# Patient Record
Sex: Female | Born: 1970
Health system: Southern US, Community
[De-identification: ages and names within clinical notes are randomized; demographics above are authoritative.]

## PROBLEM LIST (undated history)

## (undated) DIAGNOSIS — T7840XA Allergy, unspecified, initial encounter: Secondary | ICD-10-CM

## (undated) DIAGNOSIS — Z1501 Genetic susceptibility to malignant neoplasm of breast: Secondary | ICD-10-CM

## (undated) DIAGNOSIS — F419 Anxiety disorder, unspecified: Secondary | ICD-10-CM

## (undated) DIAGNOSIS — C801 Malignant (primary) neoplasm, unspecified: Secondary | ICD-10-CM

## (undated) DIAGNOSIS — E785 Hyperlipidemia, unspecified: Secondary | ICD-10-CM

## (undated) DIAGNOSIS — C50919 Malignant neoplasm of unspecified site of unspecified female breast: Secondary | ICD-10-CM

## (undated) HISTORY — PX: BREAST SURGERY: SHX581

## (undated) HISTORY — DX: Hyperlipidemia, unspecified: E78.5

## (undated) HISTORY — PX: ABDOMINAL HYSTERECTOMY: SHX81

## (undated) HISTORY — DX: Malignant (primary) neoplasm, unspecified: C80.1

## (undated) HISTORY — PX: MASTECTOMY: SHX3

## (undated) HISTORY — DX: Anxiety disorder, unspecified: F41.9

## (undated) HISTORY — DX: Allergy, unspecified, initial encounter: T78.40XA

## (undated) HISTORY — DX: Genetic susceptibility to malignant neoplasm of breast: Z15.01

---

## 1998-04-18 ENCOUNTER — Inpatient Hospital Stay (HOSPITAL_COMMUNITY): Admission: AD | Admit: 1998-04-18 | Discharge: 1998-04-21 | Payer: Self-pay | Admitting: Obstetrics and Gynecology

## 1998-05-30 ENCOUNTER — Inpatient Hospital Stay (HOSPITAL_COMMUNITY): Admission: AD | Admit: 1998-05-30 | Discharge: 1998-06-01 | Payer: Self-pay | Admitting: Obstetrics and Gynecology

## 1999-07-18 ENCOUNTER — Other Ambulatory Visit: Admission: RE | Admit: 1999-07-18 | Discharge: 1999-07-18 | Payer: Self-pay | Admitting: Obstetrics and Gynecology

## 2000-09-03 ENCOUNTER — Other Ambulatory Visit: Admission: RE | Admit: 2000-09-03 | Discharge: 2000-09-03 | Payer: Self-pay | Admitting: Obstetrics and Gynecology

## 2001-08-10 ENCOUNTER — Inpatient Hospital Stay (HOSPITAL_COMMUNITY): Admission: AD | Admit: 2001-08-10 | Discharge: 2001-08-13 | Payer: Self-pay | Admitting: Obstetrics and Gynecology

## 2001-08-10 ENCOUNTER — Encounter (INDEPENDENT_AMBULATORY_CARE_PROVIDER_SITE_OTHER): Payer: Self-pay

## 2001-09-09 ENCOUNTER — Other Ambulatory Visit: Admission: RE | Admit: 2001-09-09 | Discharge: 2001-09-09 | Payer: Self-pay | Admitting: Obstetrics and Gynecology

## 2001-12-12 ENCOUNTER — Other Ambulatory Visit: Admission: RE | Admit: 2001-12-12 | Discharge: 2001-12-12 | Payer: Self-pay | Admitting: Obstetrics and Gynecology

## 2002-09-11 ENCOUNTER — Other Ambulatory Visit: Admission: RE | Admit: 2002-09-11 | Discharge: 2002-09-11 | Payer: Self-pay | Admitting: Obstetrics and Gynecology

## 2003-09-14 ENCOUNTER — Other Ambulatory Visit: Admission: RE | Admit: 2003-09-14 | Discharge: 2003-09-14 | Payer: Self-pay | Admitting: Obstetrics and Gynecology

## 2006-03-23 ENCOUNTER — Ambulatory Visit: Payer: Self-pay

## 2006-04-07 ENCOUNTER — Ambulatory Visit: Payer: Self-pay

## 2006-04-16 ENCOUNTER — Ambulatory Visit: Payer: Self-pay | Admitting: Cardiology

## 2006-10-06 ENCOUNTER — Ambulatory Visit: Payer: Self-pay | Admitting: Cardiology

## 2007-09-29 ENCOUNTER — Ambulatory Visit: Payer: Self-pay | Admitting: Cardiology

## 2007-10-26 ENCOUNTER — Ambulatory Visit: Payer: Self-pay | Admitting: Cardiology

## 2007-10-26 LAB — CONVERTED CEMR LAB
Creatinine, Ser: 0.6 mg/dL (ref 0.4–1.2)
GFR calc Af Amer: 144 mL/min
Glucose, Bld: 115 mg/dL — ABNORMAL HIGH (ref 70–99)
Potassium: 3.8 meq/L (ref 3.5–5.1)

## 2007-10-27 ENCOUNTER — Ambulatory Visit: Payer: Self-pay | Admitting: Cardiology

## 2007-10-31 ENCOUNTER — Ambulatory Visit (HOSPITAL_COMMUNITY): Admission: RE | Admit: 2007-10-31 | Discharge: 2007-10-31 | Payer: Self-pay | Admitting: Cardiology

## 2009-11-23 HISTORY — PX: OTHER SURGICAL HISTORY: SHX169

## 2010-04-23 ENCOUNTER — Ambulatory Visit: Payer: Self-pay | Admitting: Oncology

## 2011-04-07 NOTE — Assessment & Plan Note (Signed)
[facility]                            CARDIOLOGY OFFICE NOTE   NAME:Tammy Gilmore, Tammy Gilmore                     MRN:          [id number]  DATE:09/29/2007                            DOB:          Mar 11, 1971    Tammy Gilmore is a very pleasant 40 year old female that I have seen in the  past secondary to palpitations and atypical chest pain.  Note, she had a  stress echocardiogram performed on Apr 07, 2006.  There was a question  of stress-induced ischemia in the mid septal wall.  However, she  exercised for 12 minutes and 15 seconds and is felt to be a low risk  study.  She has been treated medically.  She also has a history of  palpitations that are felt secondary to PACs and brief PAT.  Since I last saw her, she denies any dyspnea.  She does have some chest  soreness at times.  This is not clearly exertional nor is it pleuritic  or positional.  Her palpitations are unchanged.  They are brief and  nonsustained and there is no presyncope or syncope.   MEDICATIONS:  Benadryl at night and a multivitamin as needed.   PHYSICAL EXAMINATION:  VITAL SIGNS:  Show a blood pressure of 109/61,  and her pulse is 73.  She weighs 103 pounds.  HEENT:  Normal.  NECK:  Supple.  CHEST:  Clear.  CARDIOVASCULAR:  Reveals a regular rate and rhythm.  ABDOMEN:  Shows no tenderness.  EXTREMITIES:  Show no edema.   Her electrocardiogram shows a sinus rhythm at a rate of 76.  There are  no significant ST changes.   DIAGNOSES:  1. Atypical chest pain.  We will plan to proceed with a Myoview for      risk stratification.  This is particularly in light of her question      of septal ischemia on her previous stress echocardiogram.  I think      if it shows no significant abnormalities, then we will not proceed      with further ischemia evaluation.  2. Palpitations.  This is felt secondary to premature atrial      contractions.  They are self limited.  We could add a beta-blocker      in the future if they worsen.   Note, we will plan to see her back on an as needed basis.     Madolyn Frieze Jens Som, MD, [facility]  Electronically Signed    BSC/MedQ  DD: 09/29/2007  DT: 09/29/2007  Job #: [id number]

## 2011-04-10 NOTE — Op Note (Signed)
[facility]  Patient:    Tammy Gilmore, Tammy Gilmore Visit Number: [id number] MRN: [id number]          Service Type: OBS Location: 910A 9130 01 Attending Physician:  Lenoard Aden Dictated by:   Lenoard Aden, M.D. Proc. Date: 08/10/01 Admit Date:  08/10/2001                             Operative Report  PREOPERATIVE DIAGNOSES:       1. A 39-week intrauterine pregnancy.                               2. Homero Fellers breech presentation.  POSTOPERATIVE DIAGNOSES:      1. A 39-week intrauterine pregnancy.                               2. Homero Fellers breech presentation.  PROCEDURE:                    Primary low transverse cesarean section.  SURGEON:                      Lenoard Aden, M.D.  ASSISTANT:                    Genia Del, M.D.  ANESTHESIA:                   Spinal by Jean Rosenthal.  ESTIMATED BLOOD LOSS:         1000 cc.  COMPLICATIONS:                None.  DRAINS:                       Foley.  COUNTS:                       Correct.  DISPOSITION:                  Patient to recovery in good condition.  FINDINGS:                     Full-term living frank breech, Apgars 9 and 9. Placenta posterior, manually, intact with three-vessel cord noted. Normal tubes and ovaries.  DESCRIPTION OF PROCEDURE:     After being apprised of the risks of anesthesia, infection, bleeding, injury to abdominal organs with need for repair, the patient is brought to the operating room where she is administered spinal anesthetic without complications, prepped and draped in the usual sterile fashion, and Foley catheter placed. A small symptomatic perineal skin tag is removed using scissors and fine tooth pickup and sent to pathology. Good hemostasis noted. At this time, Pfannenstiel skin incision made with a scalpel after testing for adequate anesthesia and placement of dilute Marcaine solution. At this time, the incision is carried down to the fascia which  is nicked in the midline and opened transversely using Mayo scissors. Rectus muscles dissected sharply in midline and peritoneum entered sharply. Bladder blade placed. Visceral peritoneum scored in a smile-like fashion. Uterus scored in smile-like fashion. Atraumatic delivery of an 8-pound 3-ounce frank breech female using the usual maneuvers with flexion of the fetal vertex handed to the pediatricians in attendance receiving Apgars  of 8 and 9. The placenta was delivered manually, intact. Upon delivery of the placenta, there is noted to be a small defect in the posterior wall of the uterus. This does not appear to be placental tissue and appears to be most consistent with a decidual-type reaction but the decision is made not to excise this at this time due to no obvious issues regarding retained placental tissue. At this time, the incision is closed using a O Monocryl in a continuous running fashion. Good hemostasis noted. Bladder flap inspected and found to be dry. Fat fascia closed using #1 Vicryl. Upon closing half of the fascia, there is noted to be an increased amount of serosanguineous drainage, therefore, fascial incision is reopened and re-exploration reveals normal uterine incision. Paracolic gutters irrigated. All blood clots removed and good hemostasis is noted at this time. The fascia then re-closed using a #1 Vicryl in continuous running fashion. The skin closed using staples. The patient tolerates the procedure well and transfers to recovery room in good condition. Dictated by:   Lenoard Aden, M.D. Attending Physician:  Lenoard Aden DD:  08/10/01 TD:  08/10/01 Job: [id number]

## 2011-04-10 NOTE — H&P (Signed)
[facility]  Patient:    Tammy Gilmore, Tammy Gilmore Visit Number: [id number] MRN: [id number]          Service Type: OBS Location: 910A 9130 01 Attending Physician:  Lenoard Aden Dictated by:   Lenoard Aden, M.D. Admit Date:  08/10/2001                           History and Physical  CHIEF COMPLAINT:              Homero Fellers breech at term.  HISTORY OF PRESENT ILLNESS:   A 40 year old white female G2, P25, EDD August 16, 2001 at 39 weeks with persistent frank breech presentation and refusal of ECV for primary cesarean section.  PAST MEDICAL HISTORY:         Operative vaginal delivery complicated by mild shoulder dystocia in 1999.  No other medical or surgical hospitalizations.  FAMILY HISTORY:               Kidney disease, breast cancer, prostate cancer, colon cancer.  SOCIAL HISTORY:               Noncontributory.  MEDICATIONS:                  Prenatal vitamins and iron.  ALLERGIES:                    No known drug allergies.  PRENATAL LABORATORIES:        Uncomplicated.  Blood type O+.  Rh antibody negative.  Rubella immune.  Hepatitis B surface antigen negative.  HIV nonreactive.  GBS negative.  PHYSICAL EXAMINATION  GENERAL:                      Well-developed, well-nourished white female in no apparent distress.  HEENT:                        Normal.  LUNGS:                        Clear.  HEART:                        Regular rate and rhythm.  ABDOMEN:                      Soft, gravid, nontender.  Estimated fetal weight 7.5-8 pounds.  PELVIC:                       Cervix is 2 cm thick, frank breech, and -2.  EXTREMITIES:                  No cords.  NEUROLOGIC:                   Nonfocal.  IMPRESSION:                   1. A 39 week intrauterine pregnancy with                                  persistent frank breech presentation.  Patient refused ECV.                               2. History  of shoulder dystocia.  PLAN:                         Proceed with primary low segment cesarean section.  Risks of anesthesia, infection, bleeding, injury to abdominal organs, need for repair is discussed.  Delayed versus immediate complications to include bowel and bladder injury noted.  Patient acknowledges.  Will proceed. Dictated by:   Lenoard Aden, M.D. Attending Physician:  Lenoard Aden DD:  08/10/01 TD:  08/10/01 Job: [id number]

## 2011-04-10 NOTE — Discharge Summary (Signed)
[facility]  Patient:    Tammy Gilmore, Tammy Gilmore Visit Number: [id number] MRN: [id number]          Service Type: OBS Location: 910A 9130 01 Attending Physician:  Lenoard Aden Dictated by:   Lenoard Aden, M.D. Admit Date:  08/10/2001 Discharge Date: 08/13/2001                             Discharge Summary  ADMITTING DIAGNOSES:          1. A 39-week intrauterine pregnancy.                               2. Homero Fellers breech.  DISCHARGE DIAGNOSES:          1. A 39-week intrauterine pregnancy.                               2. Homero Fellers breech.  HOSPITAL COURSE:              The patient underwent an uncomplicated primary cesarean section on August 10, 2001. Her postoperative course was uncomplicated. Hemoglobin 9.5, hematocrit 26.9. The patient remained afebrile, tolerated a regular diet well, ambulated without difficulty, and was discharged to home on day #3.  DISCHARGE MEDICATIONS:        Tylox, #30, Motrin 600 mg, #20, given. Prenatal vitamins and iron recommended.  DISCHARGE FOLLOWUP:           The patient is to follow up in the office in four to six weeks. Dictated by:   Lenoard Aden, M.D. Attending Physician:  Lenoard Aden DD:  08/13/01 TD:  08/13/01 Job: [id number]

## 2012-10-13 ENCOUNTER — Other Ambulatory Visit: Payer: Self-pay

## 2013-02-27 DIAGNOSIS — Z9013 Acquired absence of bilateral breasts and nipples: Secondary | ICD-10-CM | POA: Insufficient documentation

## 2013-02-27 DIAGNOSIS — Z1501 Genetic susceptibility to malignant neoplasm of breast: Secondary | ICD-10-CM | POA: Insufficient documentation

## 2014-02-09 DIAGNOSIS — Z1501 Genetic susceptibility to malignant neoplasm of breast: Secondary | ICD-10-CM | POA: Insufficient documentation

## 2014-02-14 DIAGNOSIS — E8940 Asymptomatic postprocedural ovarian failure: Secondary | ICD-10-CM | POA: Insufficient documentation

## 2017-03-23 DIAGNOSIS — Z9013 Acquired absence of bilateral breasts and nipples: Secondary | ICD-10-CM | POA: Diagnosis not present

## 2017-04-09 DIAGNOSIS — Z9013 Acquired absence of bilateral breasts and nipples: Secondary | ICD-10-CM | POA: Diagnosis not present

## 2017-05-20 DIAGNOSIS — Z1501 Genetic susceptibility to malignant neoplasm of breast: Secondary | ICD-10-CM | POA: Diagnosis not present

## 2017-05-20 DIAGNOSIS — Z90722 Acquired absence of ovaries, bilateral: Secondary | ICD-10-CM | POA: Diagnosis not present

## 2017-05-20 DIAGNOSIS — Z803 Family history of malignant neoplasm of breast: Secondary | ICD-10-CM | POA: Diagnosis not present

## 2017-05-20 DIAGNOSIS — Z09 Encounter for follow-up examination after completed treatment for conditions other than malignant neoplasm: Secondary | ICD-10-CM | POA: Diagnosis not present

## 2017-07-18 DIAGNOSIS — N3001 Acute cystitis with hematuria: Secondary | ICD-10-CM | POA: Diagnosis not present

## 2017-08-10 DIAGNOSIS — L821 Other seborrheic keratosis: Secondary | ICD-10-CM | POA: Diagnosis not present

## 2017-08-10 DIAGNOSIS — Z85828 Personal history of other malignant neoplasm of skin: Secondary | ICD-10-CM | POA: Diagnosis not present

## 2017-08-10 DIAGNOSIS — D225 Melanocytic nevi of trunk: Secondary | ICD-10-CM | POA: Diagnosis not present

## 2017-08-10 DIAGNOSIS — L918 Other hypertrophic disorders of the skin: Secondary | ICD-10-CM | POA: Diagnosis not present

## 2017-08-10 DIAGNOSIS — L570 Actinic keratosis: Secondary | ICD-10-CM | POA: Diagnosis not present

## 2017-09-14 DIAGNOSIS — Z23 Encounter for immunization: Secondary | ICD-10-CM | POA: Diagnosis not present

## 2017-11-25 DIAGNOSIS — H524 Presbyopia: Secondary | ICD-10-CM | POA: Diagnosis not present

## 2017-11-25 DIAGNOSIS — H52223 Regular astigmatism, bilateral: Secondary | ICD-10-CM | POA: Diagnosis not present

## 2018-03-29 DIAGNOSIS — Z9013 Acquired absence of bilateral breasts and nipples: Secondary | ICD-10-CM | POA: Diagnosis not present

## 2018-08-30 DIAGNOSIS — L570 Actinic keratosis: Secondary | ICD-10-CM | POA: Diagnosis not present

## 2018-08-30 DIAGNOSIS — Z85828 Personal history of other malignant neoplasm of skin: Secondary | ICD-10-CM | POA: Diagnosis not present

## 2018-08-30 DIAGNOSIS — L821 Other seborrheic keratosis: Secondary | ICD-10-CM | POA: Diagnosis not present

## 2018-12-14 DIAGNOSIS — Z124 Encounter for screening for malignant neoplasm of cervix: Secondary | ICD-10-CM | POA: Diagnosis not present

## 2018-12-14 DIAGNOSIS — Z90722 Acquired absence of ovaries, bilateral: Secondary | ICD-10-CM | POA: Diagnosis not present

## 2018-12-14 DIAGNOSIS — Z1151 Encounter for screening for human papillomavirus (HPV): Secondary | ICD-10-CM | POA: Diagnosis not present

## 2018-12-14 DIAGNOSIS — Z1501 Genetic susceptibility to malignant neoplasm of breast: Secondary | ICD-10-CM | POA: Diagnosis not present

## 2018-12-14 DIAGNOSIS — Z1509 Genetic susceptibility to other malignant neoplasm: Secondary | ICD-10-CM | POA: Diagnosis not present

## 2018-12-14 DIAGNOSIS — N952 Postmenopausal atrophic vaginitis: Secondary | ICD-10-CM | POA: Diagnosis not present

## 2018-12-17 DIAGNOSIS — Z90722 Acquired absence of ovaries, bilateral: Secondary | ICD-10-CM | POA: Insufficient documentation

## 2019-01-16 DIAGNOSIS — R300 Dysuria: Secondary | ICD-10-CM | POA: Diagnosis not present

## 2019-01-25 ENCOUNTER — Ambulatory Visit: Payer: BLUE CROSS/BLUE SHIELD | Admitting: Family Medicine

## 2019-01-25 ENCOUNTER — Encounter: Payer: Self-pay | Admitting: Family Medicine

## 2019-01-25 VITALS — BP 130/80 | HR 78 | Temp 98.3°F | Ht 60.0 in | Wt 116.2 lb

## 2019-01-25 DIAGNOSIS — Z23 Encounter for immunization: Secondary | ICD-10-CM | POA: Diagnosis not present

## 2019-01-25 DIAGNOSIS — Z90722 Acquired absence of ovaries, bilateral: Secondary | ICD-10-CM | POA: Diagnosis not present

## 2019-01-25 DIAGNOSIS — Z9013 Acquired absence of bilateral breasts and nipples: Secondary | ICD-10-CM | POA: Diagnosis not present

## 2019-01-25 DIAGNOSIS — Z7689 Persons encountering health services in other specified circumstances: Secondary | ICD-10-CM

## 2019-01-25 NOTE — Progress Notes (Signed)
Tammy Gilmore is a 48 y.o. female  Chief Complaint  Patient presents with  . Establish Care    est care/ wants TDAP    HPI: Tammy Gilmore is a 48 y.o. female here to establish care with our office. She was last seen in 2016 by PCP Precious Haws. She would like Tdap today.   She was seen at [facility] last Monday 2/24 and diagnosed with UTI and is being treated with cipro. Her dysuria has resolved but still is having some frequency. No fever, chills, n/v, back or abdominal pain. No gross hematuria.  Pt is BRCA2 positive and is s/p B/L mastectomy with reconstruction and implants (2012) and B/L salpingo-oophorectomy (2011)  Specialists: Gyn-Onc (Dr. Rhodia Albright), plastic & reconstructive surgery (Dr. Luetta Nutting  Last CPE, labs: due  Last PAP: 11/2018  Med refills needed today: none   History reviewed. No pertinent past medical history.  Past Surgical History:  Procedure Laterality Date  . ABDOMINAL HYSTERECTOMY     ovaries and fallopian tubes  . double masectomy  2011    Social History   Socioeconomic History  . Marital status: Married    Spouse name: Not on file  . Number of children: Not on file  . Years of education: Not on file  . Highest education level: Not on file  Occupational History  . Not on file  Social Needs  . Financial resource strain: Not on file  . Food insecurity:    Worry: Not on file    Inability: Not on file  . Transportation needs:    Medical: Not on file    Non-medical: Not on file  Tobacco Use  . Smoking status: Never Smoker  . Smokeless tobacco: Never Used  Substance and Sexual Activity  . Alcohol use: Never    Frequency: Never  . Drug use: Never  . Sexual activity: Not on file  Lifestyle  . Physical activity:    Days per week: Not on file    Minutes per session: Not on file  . Stress: Not on file  Relationships  . Social connections:    Talks on phone: Not on file    Gets together: Not on file    Attends religious service: Not on file   Active member of club or organization: Not on file    Attends meetings of clubs or organizations: Not on file    Relationship status: Not on file  . Intimate partner violence:    Fear of current or ex partner: Not on file    Emotionally abused: Not on file    Physically abused: Not on file    Forced sexual activity: Not on file  Other Topics Concern  . Not on file  Social History Narrative  . Not on file    Family History  Problem Relation Age of Onset  . Cancer Father        lung  . Cancer Brother        gall bladder  . Breast cancer Paternal Grandmother      Immunization History  Administered Date(s) Administered  . Influenza, Quadrivalent, Recombinant, Inj, Pf 10/03/2018  . Influenza,inj,Quad PF,6+ Mos 11/14/2015    Outpatient Encounter Medications as of 01/25/2019  Medication Sig  . ciprofloxacin (CIPRO) 500 MG tablet TK 1 T PO BID   No facility-administered encounter medications on file as of 01/25/2019.      ROS: Gen: no fever, chills  Skin: no rash, itching ENT: nasal congestion, Rt ear  fullness, runny nose, PND; no sore throat Eyes: no blurry vision, double vision Resp: no cough, wheeze,SOB CV: no CP, palpitations, LE edema,  GI: no heartburn, n/v/d/c, abd pain GU: as above in HPI MSK: no joint pain, myalgias, back pain Neuro: no dizziness, headache, weakness, vertigo Psych: no depression, anxiety, insomnia   No Known Allergies  BP 130/80   Pulse 78   Temp 98.3 F (36.8 C) (Oral)   Ht 5' (1.524 m)   Wt 116 lb 3.2 oz (52.7 kg)   SpO2 98%   BMI 22.69 kg/m   BP Readings from Last 3 Encounters:  01/25/19 130/80     Physical Exam  Constitutional: She is oriented to person, place, and time. She appears well-developed and well-nourished. No distress.  HENT:  Head: Normocephalic and atraumatic.  Right Ear: Tympanic membrane and ear canal normal.  Left Ear: Tympanic membrane and ear canal normal.  Nose: Mucosal edema present. No rhinorrhea. Right  sinus exhibits no maxillary sinus tenderness and no frontal sinus tenderness. Left sinus exhibits no maxillary sinus tenderness and no frontal sinus tenderness.  Mouth/Throat: Mucous membranes are normal. No oropharyngeal exudate, posterior oropharyngeal edema or posterior oropharyngeal erythema.  Neck: Neck supple.  Pulmonary/Chest: Effort normal and breath sounds normal. No respiratory distress.  Lymphadenopathy:    She has no cervical adenopathy.  Neurological: She is alert and oriented to person, place, and time.     A/P:  1. Encounter to establish care with new doctor - pt due for CPE, fasting labs and will schedule appt - PAP UTD  2. Need for Tdap vaccination - Tdap vaccine greater than or equal to 7yo IM  3. Status post bilateral salpingo-oophorectomy - cont annual f/u with Gyn-Onc  4. Status post bilateral mastectomy - cont annual f/u with plastic and recon surgery

## 2019-02-01 ENCOUNTER — Encounter: Payer: BLUE CROSS/BLUE SHIELD | Admitting: Family Medicine

## 2019-02-08 ENCOUNTER — Other Ambulatory Visit: Payer: Self-pay

## 2019-02-08 ENCOUNTER — Encounter: Payer: Self-pay | Admitting: Family Medicine

## 2019-02-08 ENCOUNTER — Ambulatory Visit: Payer: BLUE CROSS/BLUE SHIELD | Admitting: Family Medicine

## 2019-02-08 VITALS — BP 108/72 | HR 76 | Temp 98.5°F | Ht 60.0 in | Wt 113.4 lb

## 2019-02-08 DIAGNOSIS — Z0000 Encounter for general adult medical examination without abnormal findings: Secondary | ICD-10-CM | POA: Diagnosis not present

## 2019-02-08 LAB — TSH: TSH: 1.79 u[IU]/mL (ref 0.35–4.50)

## 2019-02-08 LAB — LIPID PANEL
Cholesterol: 267 mg/dL — ABNORMAL HIGH (ref 0–200)
HDL: 73.6 mg/dL (ref >39.00)
LDL Cholesterol: 175 mg/dL — ABNORMAL HIGH (ref 0–99)
NONHDL: 193.36
Total CHOL/HDL Ratio: 4
Triglycerides: 91 mg/dL (ref 0.0–149.0)
VLDL: 18.2 mg/dL (ref 0.0–40.0)

## 2019-02-08 LAB — AST: AST: 17 U/L (ref 0–37)

## 2019-02-08 LAB — BASIC METABOLIC PANEL
BUN: 14 mg/dL (ref 6–23)
CO2: 29 mEq/L (ref 19–32)
CREATININE: 0.55 mg/dL (ref 0.40–1.20)
Calcium: 9.5 mg/dL (ref 8.4–10.5)
Chloride: 104 mEq/L (ref 96–112)
GFR: 117.09 mL/min (ref >60.00)
Glucose, Bld: 100 mg/dL — ABNORMAL HIGH (ref 70–99)
POTASSIUM: 4.2 meq/L (ref 3.5–5.1)
Sodium: 140 mEq/L (ref 135–145)

## 2019-02-08 LAB — VITAMIN D 25 HYDROXY (VIT D DEFICIENCY, FRACTURES): VITD: 25.63 ng/mL — AB (ref 30.00–100.00)

## 2019-02-08 LAB — ALT: ALT: 22 U/L (ref 0–35)

## 2019-02-08 NOTE — Patient Instructions (Signed)

## 2019-02-08 NOTE — Progress Notes (Signed)
Tammy Gilmore is a 48 y.o. female  Chief Complaint  Patient presents with  . Annual Exam    CPE/ fasting     HPI: Tammy Gilmore is a 48 y.o. female here for CPE and fasting labs. She has no active issues or concerns. She notes seasonal allergy symptoms and just this week started taking claritin daily and using nasonex.   Last PAP: 11/2018 Last mammo: Pt is BRCA2 positive and is s/p B/L mastectomy with reconstruction and implants (2012)   Diet/Exercise: goes to gym regularly; healthy diet  Med refills needed today? none    No past medical history on file.  Past Surgical History:  Procedure Laterality Date  . ABDOMINAL HYSTERECTOMY     ovaries and fallopian tubes  . double masectomy  2011    Social History   Socioeconomic History  . Marital status: Married    Spouse name: Not on file  . Number of children: Not on file  . Years of education: Not on file  . Highest education level: Not on file  Occupational History  . Not on file  Social Needs  . Financial resource strain: Not on file  . Food insecurity:    Worry: Not on file    Inability: Not on file  . Transportation needs:    Medical: Not on file    Non-medical: Not on file  Tobacco Use  . Smoking status: Never Smoker  . Smokeless tobacco: Never Used  Substance and Sexual Activity  . Alcohol use: Never    Frequency: Never  . Drug use: Never  . Sexual activity: Not on file  Lifestyle  . Physical activity:    Days per week: Not on file    Minutes per session: Not on file  . Stress: Not on file  Relationships  . Social connections:    Talks on phone: Not on file    Gets together: Not on file    Attends religious service: Not on file    Active member of club or organization: Not on file    Attends meetings of clubs or organizations: Not on file    Relationship status: Not on file  . Intimate partner violence:    Fear of current or ex partner: Not on file    Emotionally abused: Not on file   Physically abused: Not on file    Forced sexual activity: Not on file  Other Topics Concern  . Not on file  Social History Narrative  . Not on file    Family History  Problem Relation Age of Onset  . Cancer Father        lung  . Cancer Brother        gall bladder  . Breast cancer Paternal Grandmother      Immunization History  Administered Date(s) Administered  . Influenza, Quadrivalent, Recombinant, Inj, Pf 10/03/2018  . Influenza,inj,Quad PF,6+ Mos 11/14/2015  . Tdap 01/25/2019    Outpatient Encounter Medications as of 02/08/2019  Medication Sig  . [DISCONTINUED] ciprofloxacin (CIPRO) 500 MG tablet TK 1 T PO BID   No facility-administered encounter medications on file as of 02/08/2019.      ROS: Gen: no fever, chills  Skin: no rash, itching ENT: no ear pain, ear drainage, + nasal congestion, rhinorrhea, PND; no sinus pressure, sore throat Resp: no cough, wheeze,SOB Breast: no breast tenderness, no nipple discharge, no breast masses CV: no CP, palpitations, LE edema,  GI: no heartburn, n/v/d/c, abd pain GU:  no dysuria, urgency, frequency, hematuria MSK: no joint pain, myalgias, back pain Neuro: no dizziness, headache, weakness, vertigo Psych: no depression, anxiety, insomnia   No Known Allergies  BP 108/72   Pulse 76   Temp 98.5 F (36.9 C) (Oral)   Ht 5' (1.524 m)   Wt 113 lb 6.4 oz (51.4 kg)   SpO2 97%   BMI 22.15 kg/m   Physical Exam  Constitutional: She is oriented to person, place, and time. She appears well-developed and well-nourished. No distress.  HENT:  Head: Normocephalic and atraumatic.  Right Ear: Tympanic membrane and ear canal normal.  Left Ear: Tympanic membrane and ear canal normal.  Nose: Nose normal.  Mouth/Throat: Oropharynx is clear and moist.  Neck: Neck supple. No thyromegaly present.  Cardiovascular: Normal rate, regular rhythm, normal heart sounds and intact distal pulses.  Pulmonary/Chest: Effort normal and breath sounds  normal. No respiratory distress.  Abdominal: Soft. Bowel sounds are normal. She exhibits no distension and no mass. There is no abdominal tenderness.  Musculoskeletal: Normal range of motion.        General: No edema.  Lymphadenopathy:    She has no cervical adenopathy.  Neurological: She is alert and oriented to person, place, and time. Coordination normal.  Skin: Skin is warm and dry.  Psychiatric: She has a normal mood and affect. Her behavior is normal.     A/P:  1. Annual physical exam - PAP UTD, not a candidate for mammo - discussed importance of regular CV exercise, healthy diet, adequate sleep and encouraged pt to continue this - dental and eye exams UTD - immunizations UTD - ALT - Basic metabolic panel - VITAMIN D 25 Hydroxy (Vit-D Deficiency, Fractures) - AST - Lipid panel - TSH - pt requests TSH be checked - next CPE in 1 year

## 2019-02-09 ENCOUNTER — Encounter: Payer: Self-pay | Admitting: Family Medicine

## 2019-02-23 ENCOUNTER — Ambulatory Visit: Payer: Self-pay | Admitting: *Deleted

## 2019-02-23 NOTE — Telephone Encounter (Signed)
Dr. Loletha Grayer please advise would you like me to set up webex?

## 2019-02-23 NOTE — Telephone Encounter (Signed)
Pt reports "Very slight" chest tightness, onset during night. Denies pain, SOB. States also had chills last night, states afebrile. States had sinus infection in Jan. 2020 "And this feels like one is coming on." Reports mild sinus tenderness, no cough. Also reports 2 loose BMs this am. Reports increased fatigue. Has to rest between usual ADLs. Pts email verified, phone # verified. Directed pt to ED if symtpoms worsen. Please advise regarding virtual appt; reviewed process with patient, verbalizes understanding.   Reason for Disposition . [1] Chest pain lasting <= 5 minutes AND [2] NO chest pain or cardiac symptoms now(Exceptions: pains lasting a few seconds)    Intermittent "Tightness"  Answer Assessment - Initial Assessment Questions 1. LOCATION: "Where does it hurt?"       tightness 2. RADIATION: "Does the pain go anywhere else?" (e.g., into neck, jaw, arms, back)    no 3. ONSET: "When did the chest pain begin?" (Minutes, hours or days)      LAst night 4. PATTERN "Does the pain come and go, or has it been constant since it started?"  "Does it get worse with exertion?"      Comes and goes 5. DURATION: "How long does it last" (e.g., seconds, minutes, hours)     mild 6. SEVERITY: "How bad is the pain?"  (e.g., Scale 1-10; mild, moderate, or severe)    - MILD (1-3): doesn't interfere with normal activities     - MODERATE (4-7): interferes with normal activities or awakens from sleep    - SEVERE (8-10): excruciating pain, unable to do any normal activities       Mild 7. CARDIAC RISK FACTORS: "Do you have any history of heart problems or risk factors for heart disease?" (e.g., prior heart attack, angina; high blood pressure, diabetes, being overweight, high cholesterol, smoking, or strong family history of heart disease)     *No Answer* 8. PULMONARY RISK FACTORS: "Do you have any history of lung disease?"  (e.g., blood clots in lung, asthma, emphysema, birth control pills)     *No Answer* 9.  CAUSE: "What do you think is causing the chest pain?"     Maybe sinus infection, had in Jan. Feels  Like that 10. OTHER SYMPTOMS: "Do you have any other symptoms?" (e.g., dizziness, nausea, vomiting, sweating, fever, difficulty breathing, cough)       Sinus tenderness, chills, loose stools x 2 , increased fatigue 11. PREGNANCY: "Is there any chance you are pregnant?" "When was your last menstrual period?"       *No Answer*  Protocols used: CHEST PAIN-A-AH

## 2019-02-23 NOTE — Telephone Encounter (Signed)
webex made for 4/3 at [facility]

## 2019-02-23 NOTE — Telephone Encounter (Signed)
Yes please set up webex appt for pt tomorrow 4/3

## 2019-02-24 ENCOUNTER — Other Ambulatory Visit: Payer: Self-pay

## 2019-02-24 ENCOUNTER — Ambulatory Visit (INDEPENDENT_AMBULATORY_CARE_PROVIDER_SITE_OTHER): Payer: BLUE CROSS/BLUE SHIELD | Admitting: Family Medicine

## 2019-02-24 ENCOUNTER — Encounter: Payer: Self-pay | Admitting: Family Medicine

## 2019-02-24 VITALS — Temp 98.0°F

## 2019-02-24 DIAGNOSIS — J302 Other seasonal allergic rhinitis: Secondary | ICD-10-CM

## 2019-02-24 NOTE — Progress Notes (Signed)
Virtual Visit via Video Note  I connected with Tammy Gilmore on 02/24/19 at  9:00 AM EDT by a video enabled telemedicine application and verified that I am speaking with the correct person using two identifiers. Location patient: home Location provider: work or home office Persons participating in the virtual visit: patient, provider  I discussed the limitations of evaluation and management by telemedicine and the availability of in person appointments. The patient expressed understanding and agreed to proceed.  Chief Complaint  Patient presents with  . Headache    chills,postnasal drip,no fever/ OTC musinex and tylenol/ 2days     HPI: Tammy Gilmore is a 48 y.o. female who complains of 2 day h/o "mild sinus headache", PND, throat clearning. Pt states she "felt chilled" 2 nights ago.  No fever. No myalgias. No runny nose, nasal congestion, sore throat. No cough or SOB. No fatigue. 2 episodes of loose stools 2 days ago, normal daily BM since then. No n/v. No abd pain. Pt has been taking tylenol PRN and mucinex. She has flonase and zyrtec but has not been taking.  No known sick contacts. No travel outside of [place] area. She does endorse a h/o seasonal allergies.   No past medical history on file.  Past Surgical History:  Procedure Laterality Date  . ABDOMINAL HYSTERECTOMY     ovaries and fallopian tubes  . double masectomy  2011    Family History  Problem Relation Age of Onset  . Cancer Father        lung  . Cancer Brother        gall bladder  . Breast cancer Paternal Grandmother     Social History   Tobacco Use  . Smoking status: Never Smoker  . Smokeless tobacco: Never Used  Substance Use Topics  . Alcohol use: Never    Frequency: Never  . Drug use: Never    No current outpatient medications on file.  No Known Allergies    ROS: See pertinent positives and negatives per HPI.   EXAM:  VITALS per patient if applicable:  GENERAL: alert, oriented,  appears well and in no acute distress  HEENT: atraumatic, conjunctiva clear, no obvious abnormalities on inspection of external nose and ears  NECK: normal movements of the head and neck  LUNGS: on inspection no signs of respiratory distress, breathing rate appears normal, no obvious gross SOB, gasping or wheezing, no conversational dyspnea  CV: no obvious cyanosis  MS: moves all visible extremities without noticeable abnormality  PSYCH/NEURO: pleasant and cooperative, no obvious depression or anxiety, speech and thought processing grossly intact   ASSESSMENT AND PLAN:  1. Seasonal allergies - restart zyrtec and flonase daily - add nasal saline spray 3x/day  - cont supportive care to include increased fluids, rest, tylenol PRN - ok to continue with mucinex BID x 1 week - f/u if symptoms worsen at any point or do not improve in 2 wks   I discussed the assessment and treatment plan with the patient. The patient was provided an opportunity to ask questions and all were answered. The patient agreed with the plan and demonstrated an understanding of the instructions.   The patient was advised to call back or seek an in-person evaluation if the symptoms worsen or if the condition fails to improve as anticipated.   Letta Median, DO

## 2019-02-28 ENCOUNTER — Encounter: Payer: Self-pay | Admitting: Family Medicine

## 2019-03-02 ENCOUNTER — Encounter: Payer: Self-pay | Admitting: Family Medicine

## 2019-03-02 ENCOUNTER — Ambulatory Visit (INDEPENDENT_AMBULATORY_CARE_PROVIDER_SITE_OTHER): Payer: BLUE CROSS/BLUE SHIELD | Admitting: Family Medicine

## 2019-03-02 ENCOUNTER — Ambulatory Visit: Payer: Self-pay

## 2019-03-02 VITALS — Temp 98.5°F

## 2019-03-02 DIAGNOSIS — Z634 Disappearance and death of family member: Secondary | ICD-10-CM | POA: Insufficient documentation

## 2019-03-02 DIAGNOSIS — F4329 Adjustment disorder with other symptoms: Secondary | ICD-10-CM | POA: Insufficient documentation

## 2019-03-02 DIAGNOSIS — J301 Allergic rhinitis due to pollen: Secondary | ICD-10-CM | POA: Diagnosis not present

## 2019-03-02 MED ORDER — PREDNISONE 10 MG PO TABS: 10.0000 mg | ORAL_TABLET | Freq: Two times a day (BID) | ORAL | 0 refills | Status: AC

## 2019-03-02 MED ORDER — HYDROXYZINE HCL 10 MG PO TABS: 10.0000 mg | ORAL_TABLET | Freq: Three times a day (TID) | ORAL | 0 refills | Status: DC | PRN

## 2019-03-02 NOTE — Progress Notes (Signed)
Virtual Visit via Video Note  I connected with Tammy Gilmore on 03/02/19 at  2:30 PM EDT by a video enabled telemedicine application and verified that I am speaking with the correct person using two identifiers.   I discussed the limitations of evaluation and management by telemedicine and the availability of in person appointments. The patient expressed understanding and agreed to proceed.  History of Present Illness:    Observations/Objective:   Assessment and Plan:   Follow Up Instructions:    I discussed the assessment and treatment plan with the patient. The patient was provided an opportunity to ask questions and all were answered. The patient agreed with the plan and demonstrated an understanding of the instructions.   The patient was advised to call back or seek an in-person evaluation if the symptoms worsen or if the condition fails to improve as anticipated.  I provided 20 minutes of non-face-to-face time during this encounter.   Libby Maw, MD   Established Patient Office Visit  Subjective:  Patient ID: Tammy Gilmore, female    DOB: 1971/06/02  Age: 48 y.o. MRN: [id number]  CC:  Chief Complaint  Patient presents with  . Diarrhea    loose stool, a little short of breath, no fever, fatigued/not feeling better from last visit with Dr. Loletha Grayer 4/3 dx with allergies     HPI KISHANA BATTEY presents for follow-up status post 8-day history of itchy nose postnasal drip sneezing and watery eyes.  She feels stressed and is worried about having a Covid infection but denies fevers chills, malaise, fatigue, wheezing, shortness of breath, dyspnea, facial pressure, teeth pain, rhinorrhea or phlegm.  She has no asthma history.  She has never smoked.  She is feeling better.  She denies nausea and vomiting but has experienced some decreased appetite.  She is having some loose stool but not denies watery diarrhea.  No past medical history on file.  Past Surgical  History:  Procedure Laterality Date  . ABDOMINAL HYSTERECTOMY     ovaries and fallopian tubes  . double masectomy  2011    Family History  Problem Relation Age of Onset  . Cancer Father        lung  . Cancer Brother        gall bladder  . Breast cancer Paternal Grandmother     Social History   Socioeconomic History  . Marital status: Married    Spouse name: Not on file  . Number of children: Not on file  . Years of education: Not on file  . Highest education level: Not on file  Occupational History  . Not on file  Social Needs  . Financial resource strain: Not on file  . Food insecurity:    Worry: Not on file    Inability: Not on file  . Transportation needs:    Medical: Not on file    Non-medical: Not on file  Tobacco Use  . Smoking status: Never Smoker  . Smokeless tobacco: Never Used  Substance and Sexual Activity  . Alcohol use: Never    Frequency: Never  . Drug use: Never  . Sexual activity: Not on file  Lifestyle  . Physical activity:    Days per week: Not on file    Minutes per session: Not on file  . Stress: Not on file  Relationships  . Social connections:    Talks on phone: Not on file    Gets together: Not on file  Attends religious service: Not on file    Active member of club or organization: Not on file    Attends meetings of clubs or organizations: Not on file    Relationship status: Not on file  . Intimate partner violence:    Fear of current or ex partner: Not on file    Emotionally abused: Not on file    Physically abused: Not on file    Forced sexual activity: Not on file  Other Topics Concern  . Not on file  Social History Narrative  . Not on file    No outpatient medications prior to visit.   No facility-administered medications prior to visit.     No Known Allergies  ROS Review of Systems  Constitutional: Negative for chills, diaphoresis, fatigue, fever and unexpected weight change.  HENT: Positive for congestion,  postnasal drip, rhinorrhea and sneezing. Negative for sinus pressure and sinus pain.   Eyes: Positive for itching. Negative for photophobia and visual disturbance.  Respiratory: Positive for cough. Negative for shortness of breath and wheezing.   Cardiovascular: Negative.   Gastrointestinal: Negative for diarrhea, nausea and vomiting.  Genitourinary: Negative.   Musculoskeletal: Negative for arthralgias and myalgias.  Skin: Negative for pallor and rash.  Allergic/Immunologic: Negative for immunocompromised state.  Neurological: Negative for light-headedness and headaches.  Hematological: Does not bruise/bleed easily.  Psychiatric/Behavioral: The patient is nervous/anxious.       Objective:    Physical Exam  Constitutional: She is oriented to person, place, and time. She appears well-developed and well-nourished. No distress.  HENT:  Head: Normocephalic and atraumatic.  Right Ear: External ear normal.  Left Ear: External ear normal.  Eyes: Conjunctivae are normal. Right eye exhibits no discharge. Left eye exhibits no discharge. No scleral icterus.  Neck: No JVD present. No tracheal deviation present.  Pulmonary/Chest: Effort normal. No stridor.  Neurological: She is alert and oriented to person, place, and time.  Skin: Skin is dry. She is not diaphoretic.  Psychiatric: She has a normal mood and affect. Her behavior is normal.    Temp 98.5 F (36.9 C) (Oral) Comment: pt reported Wt Readings from Last 3 Encounters:  02/08/19 113 lb 6.4 oz (51.4 kg)  01/25/19 116 lb 3.2 oz (52.7 kg)     Health Maintenance Due  Topic Date Due  . HIV Screening  04/18/1984  . PAP SMEAR-Modifier  04/18/1990    There are no preventive care reminders to display for this patient.  Lab Results  Component Value Date   TSH 1.79 02/08/2019   No results found for: WBC, HGB, HCT, MCV, PLT Lab Results  Component Value Date   NA 140 02/08/2019   K 4.2 02/08/2019   CO2 29 02/08/2019   GLUCOSE 100  (H) 02/08/2019   BUN 14 02/08/2019   CREATININE 0.55 02/08/2019   AST 17 02/08/2019   ALT 22 02/08/2019   CALCIUM 9.5 02/08/2019   GFR 117.09 02/08/2019   Lab Results  Component Value Date   CHOL 267 (H) 02/08/2019   Lab Results  Component Value Date   HDL 73.60 02/08/2019   Lab Results  Component Value Date   LDLCALC 175 (H) 02/08/2019   Lab Results  Component Value Date   TRIG 91.0 02/08/2019   Lab Results  Component Value Date   CHOLHDL 4 02/08/2019   No results found for: HGBA1C    Assessment & Plan:   Problem List Items Addressed This Visit      Respiratory  Seasonal allergic rhinitis due to pollen - Primary   Relevant Medications   hydrOXYzine (ATARAX/VISTARIL) 10 MG tablet   predniSONE (DELTASONE) 10 MG tablet     Other   Stress and adjustment reaction   Relevant Medications   hydrOXYzine (ATARAX/VISTARIL) 10 MG tablet      Meds ordered this encounter  Medications  . hydrOXYzine (ATARAX/VISTARIL) 10 MG tablet    Sig: Take 1 tablet (10 mg total) by mouth 3 (three) times daily as needed.    Dispense:  30 tablet    Refill:  0  . predniSONE (DELTASONE) 10 MG tablet    Sig: Take 1 tablet (10 mg total) by mouth 2 (two) times daily with a meal for 5 days.    Dispense:  10 tablet    Refill:  0    Follow-up: Return in about 1 week (around 03/09/2019), or if symptoms worsen or fail to improve.   She will use the Atarax for anxiety and allergy symptoms.  Drowsy cautions were given.  Low-dose prednisone given to allow more time Flonase to work.  Reassurance was recommended at this time.  Follow-up in 1 week if not improving. Libby Maw, MD

## 2019-03-02 NOTE — Telephone Encounter (Signed)
Patient called and says she sent Dr. Bryan Lemma a message about her breathing. She says that she feels like she has to take a deep breath, not SOB, no CP, no cough. She says she doesn't have a fever, no chills, still with the mild diarrhea, which is what she had the virtual visit for. She says she still feels tired, no energy, no appetite. She says something needs to be done, so she can get over whatever this is. I advised Dr. Bryan Lemma is not in the office today, but she can still have an appointment with another provider, she agrees. I called the office and spoke to [place], [facility] who asks to speak to the patient, the call was connected and transferred successfully.   Answer Assessment - Initial Assessment Questions 1. MAIN CONCERN OR SYMPTOM:  "What is your main concern right now?" "What question do you have?" "What's the main symptom you're worried about?" (e.g., breathing difficulty, cough, fever. pain)     Feels like I have to take a deep breath 2. ONSET: "When did the breathing problem start?"     This morning 3. BETTER-SAME-WORSE: "Are you getting better, staying the same, or getting worse compared to how you felt at your last visit to the doctor (most recent medical visit)"?     Feel the same, but the breathing part feels a little worse 4. VISIT DATE: "When were you seen?" (Date)     Virtual visit on Friday, 02/24/19 5. VISIT DOCTOR: "What is the name of the doctor taking care of you now?"     Dr. Bryan Lemma 6. VISIT DIAGNOSIS:  "What was the main symptom or problem that you were seen for?" "Were you given a diagnosis?"      Chills, mild diarrhea. Just said it was a viral infection, treat symptoms 7. VISIT MEDICATIONS: "Did the physician order any new medicines for you to use?" If yes: "Have you filled the prescription and started taking the medicine?"      No 8. NEXT APPOINTMENT: "Have you scheduled a follow-up appointment with your doctor?"     No 9. PAIN: "Is there any pain?" If so, ask:  "How bad is it?"  (Scale 1-10; or mild, moderate, severe)     No 10. FEVER: "Do you have a fever?" If so, ask: "What is it, how was it measured  and when did it start?"      No 11. OTHER SYMPTOMS: "Do you have any other symptoms?"      Mild diarrhea, no appetite, still tired and weak, but can still function  Protocols used: RECENT MEDICAL VISIT FOR ILLNESS FOLLOW-UP CALL-A-AH

## 2019-04-14 ENCOUNTER — Emergency Department (HOSPITAL_BASED_OUTPATIENT_CLINIC_OR_DEPARTMENT_OTHER): Payer: BLUE CROSS/BLUE SHIELD

## 2019-04-14 ENCOUNTER — Encounter (HOSPITAL_BASED_OUTPATIENT_CLINIC_OR_DEPARTMENT_OTHER): Payer: Self-pay

## 2019-04-14 ENCOUNTER — Ambulatory Visit: Payer: Self-pay | Admitting: Family Medicine

## 2019-04-14 ENCOUNTER — Other Ambulatory Visit: Payer: Self-pay

## 2019-04-14 ENCOUNTER — Emergency Department (HOSPITAL_BASED_OUTPATIENT_CLINIC_OR_DEPARTMENT_OTHER)
Admission: EM | Admit: 2019-04-14 | Discharge: 2019-04-14 | Disposition: A | Discharge status: Home / Self Care | Payer: BLUE CROSS/BLUE SHIELD | Attending: Emergency Medicine | Admitting: Emergency Medicine

## 2019-04-14 DIAGNOSIS — R0789 Other chest pain: Secondary | ICD-10-CM | POA: Diagnosis not present

## 2019-04-14 DIAGNOSIS — R079 Chest pain, unspecified: Secondary | ICD-10-CM | POA: Diagnosis not present

## 2019-04-14 HISTORY — DX: Malignant neoplasm of unspecified site of unspecified female breast: C50.919

## 2019-04-14 LAB — COMPREHENSIVE METABOLIC PANEL
ALT: 29 U/L (ref 0–44)
AST: 19 U/L (ref 15–41)
Albumin: 5.1 g/dL — ABNORMAL HIGH (ref 3.5–5.0)
Alkaline Phosphatase: 59 U/L (ref 38–126)
Anion gap: 8 (ref 5–15)
BUN: 9 mg/dL (ref 6–20)
CO2: 25 mmol/L (ref 22–32)
Calcium: 9.4 mg/dL (ref 8.9–10.3)
Chloride: 108 mmol/L (ref 98–111)
Creatinine, Ser: 0.47 mg/dL (ref 0.44–1.00)
GFR calc Af Amer: >60 mL/min (ref >60)
GFR calc non Af Amer: >60 mL/min (ref >60)
Glucose, Bld: 122 mg/dL — ABNORMAL HIGH (ref 70–99)
Potassium: 3.8 mmol/L (ref 3.5–5.1)
Sodium: 141 mmol/L (ref 135–145)
Total Bilirubin: 0.5 mg/dL (ref 0.3–1.2)
Total Protein: 7.8 g/dL (ref 6.5–8.1)

## 2019-04-14 LAB — CBC
HCT: 39.2 % (ref 36.0–46.0)
Hemoglobin: 12.6 g/dL (ref 12.0–15.0)
MCH: 29.9 pg (ref 26.0–34.0)
MCHC: 32.1 g/dL (ref 30.0–36.0)
MCV: 93.1 fL (ref 80.0–100.0)
Platelets: 437 10*3/uL — ABNORMAL HIGH (ref 150–400)
RBC: 4.21 MIL/uL (ref 3.87–5.11)
RDW: 11.8 % (ref 11.5–15.5)
WBC: 7.1 10*3/uL (ref 4.0–10.5)
nRBC: 0 % (ref 0.0–0.2)

## 2019-04-14 LAB — D-DIMER, QUANTITATIVE (NOT AT ARMC): D-Dimer, Quant: <0.27 ug/mL-FEU (ref 0.00–0.50)

## 2019-04-14 LAB — TROPONIN I: Troponin I: <0.03 ng/mL (ref <0.03)

## 2019-04-14 LAB — LIPASE, BLOOD: Lipase: 28 U/L (ref 11–51)

## 2019-04-14 NOTE — ED Notes (Signed)
Patient transported to X-ray 

## 2019-04-14 NOTE — ED Notes (Signed)
Pt amb to [facility]

## 2019-04-14 NOTE — Telephone Encounter (Signed)
Pt called in while driving home from the beach by herself.   She is having chest pain.  She is just outside of [place] and wants to go to a [facility].  She is going to [facility] ED.  She is 30 minutes away.   I let her know to pull over and call 911 if she gets short of breath, sweating, lightheaded or the chest pain increases.   She verbalized understanding and is going straight to [facility].  I sent my notes to Dr. Reyne Dumas so she would be aware.    Reason for Disposition . [1] Chest pain lasts > 5 minutes AND [2] described as crushing, pressure-like, or heavy  Answer Assessment - Initial Assessment Questions 1. LOCATION: "Where does it hurt?"       I'm been having chest pain on and off for 2 weeks now.   It's on the right side of my chest.  I've taken several different OTC antiacids.   Nothing has helped.   I took a hydroxyzine but it didn't help.  Denies dizziness 2. RADIATION: "Does the pain go anywhere else?" (e.g., into neck, jaw, arms, back)     No 3. ONSET: "When did the chest pain begin?" (Minutes, hours or days)      On and off for a couple weeks.   Today it has moved towards middle of my chest but also into left side near breast. 4. PATTERN "Does the pain come and go, or has it been constant since it started?"  "Does it get worse with exertion?"      Intermittent.    I'm driving home from the beach by myself.  I'm near [place] now.    5. DURATION: "How long does it last" (e.g., seconds, minutes, hours)     10-30 minutes.    My stomach had a little nausea this morning but it's gone.   I had a BM twice today.   No diarrhea 6. SEVERITY: "How bad is the pain?"  (e.g., Scale 1-10; mild, moderate, or severe)    - MILD (1-3): doesn't interfere with normal activities     - MODERATE (4-7): interferes with normal activities or awakens from sleep    - SEVERE (8-10): excruciating pain, unable to do any normal activities       This morning 4-5 now it's a 2 on the  scale. 7. CARDIAC RISK FACTORS: "Do you have any history of heart problems or risk factors for heart disease?" (e.g., prior heart attack, angina; high blood pressure, diabetes, being overweight, high cholesterol, smoking, or strong family history of heart disease)     12 years ago I was checked by the heart doctor and I was fine.   8. PULMONARY RISK FACTORS: "Do you have any history of lung disease?"  (e.g., blood clots in lung, asthma, emphysema, birth control pills)     No   9. CAUSE: "What do you think is causing the chest pain?"     I have no idea. 10. OTHER SYMPTOMS: "Do you have any other symptoms?" (e.g., dizziness, nausea, vomiting, sweating, fever, difficulty breathing, cough)       Dizziness, nausea this morning.  No shortness of breath.  No cough.   11. PREGNANCY: "Is there any chance you are pregnant?" "When was your last menstrual period?"       No  Ovaries removed  Protocols used: CHEST PAIN-A-AH

## 2019-04-14 NOTE — Telephone Encounter (Signed)
Dr. C FYI. 

## 2019-04-14 NOTE — ED Triage Notes (Signed)
C/o intermittent CP x 2 weeks-denies flu sx/fever-NAD-steady gait

## 2019-04-14 NOTE — ED Provider Notes (Signed)
[facility] EMERGENCY DEPARTMENT Provider Note   CSN: [id number] Arrival date & time: 04/14/19  1253    History   Chief Complaint Chief Complaint  Patient presents with  . Chest Pain    HPI LISANNE PONCE is a 48 y.o. female.     48yo F w/ PMH including BRCA2 s/p TAH, b/l mastectomy, seasonal allergies who p/w chest pain.  Patient reports 1 month of intermittent, central to right-sided chest pain that she describes as a mild pressure or heaviness.  Pain is not associated with exertion or eating and does not feel like reflux.  It has been more constant today since this morning and she may have slight nausea and shortness of breath with it but she is also not sure if this is due to feeling anxious from it.  She was driving home from the beach today and decided to come to the ER for evaluation.  She denies any vomiting, diaphoresis, lightheadedness, leg swelling, cough/URI symptoms, fevers, recent illness, or sick contacts.  Family history notable for father with pacemaker.  She denies any history of blood clots, no estrogen use.  No tobacco, alcohol, or drug use.  The history is provided by the patient.  Chest Pain    Past Medical History:  Diagnosis Date  . Breast cancer [facility])     Patient Active Problem List   Diagnosis Date Noted  . Seasonal allergic rhinitis due to pollen 03/02/2019  . Stress and adjustment reaction 03/02/2019  . Status post bilateral salpingo-oophorectomy 12/17/2018  . Surgical menopause 02/14/2014  . Genetic susceptibility to breast cancer 02/09/2014  . BRCA2 positive 02/27/2013  . Status post bilateral mastectomy 02/27/2013    Past Surgical History:  Procedure Laterality Date  . ABDOMINAL HYSTERECTOMY     ovaries and fallopian tubes  . double masectomy  2011  . MASTECTOMY       OB History   No obstetric history on file.      Home Medications    Prior to Admission medications   Medication Sig Start Date End Date Taking?  Authorizing Provider  hydrOXYzine (ATARAX/VISTARIL) 10 MG tablet Take 1 tablet (10 mg total) by mouth 3 (three) times daily as needed. 03/02/19   Libby Maw, MD    Family History Family History  Problem Relation Age of Onset  . Cancer Father        lung  . Cancer Brother        gall bladder  . Breast cancer Paternal Grandmother     Social History Social History   Tobacco Use  . Smoking status: Never Smoker  . Smokeless tobacco: Never Used  Substance Use Topics  . Alcohol use: Yes    Frequency: Never    Comment: occ  . Drug use: Never     Allergies   Patient has no known allergies.   Review of Systems Review of Systems  Cardiovascular: Positive for chest pain.   All other systems reviewed and are negative except that which was mentioned in HPI   Physical Exam Updated Vital Signs BP 136/82 (BP Location: Right Arm)   Pulse (!) 103   Temp 98.5 F (36.9 C) (Oral)   Resp (!) 22   Ht 5' (1.524 m)   Wt 49.9 kg   SpO2 100%   BMI 21.48 kg/m   Physical Exam Vitals signs and nursing note reviewed.  Constitutional:      General: She is not in acute distress.    Appearance:  She is well-developed.  HENT:     Head: Normocephalic and atraumatic.  Eyes:     Conjunctiva/sclera: Conjunctivae normal.  Neck:     Musculoskeletal: Neck supple.  Cardiovascular:     Rate and Rhythm: Regular rhythm. Tachycardia present.     Heart sounds: Normal heart sounds. No murmur.     Comments: Mild tachycardia Pulmonary:     Effort: Pulmonary effort is normal.     Breath sounds: Normal breath sounds.  Abdominal:     General: Bowel sounds are normal. There is no distension.     Palpations: Abdomen is soft.     Tenderness: There is no abdominal tenderness.  Musculoskeletal:     Right lower leg: No edema.     Left lower leg: No edema.  Skin:    General: Skin is warm and dry.  Neurological:     Mental Status: She is alert and oriented to person, place, and time.      Comments: Fluent speech  Psychiatric:        Judgment: Judgment normal.      ED Treatments / Results  Labs (all labs ordered are listed, but only abnormal results are displayed) Labs Reviewed  COMPREHENSIVE METABOLIC PANEL - Abnormal; Notable for the following components:      Result Value   Glucose, Bld 122 (*)    Albumin 5.1 (*)    All other components within normal limits  CBC - Abnormal; Notable for the following components:   Platelets 437 (*)    All other components within normal limits  LIPASE, BLOOD  TROPONIN I  D-DIMER, QUANTITATIVE (NOT AT [facility])    EKG EKG Interpretation  Date/Time:  Friday Apr 14 2019 13:05:13 EDT Ventricular Rate:  105 PR Interval:    QRS Duration: 84 QT Interval:  338 QTC Calculation: 447 R Axis:   66 Text Interpretation:  Sinus tachycardia Ventricular premature complex Biatrial enlargement Probable anteroseptal infarct, old Borderline repolarization abnormality Baseline wander in lead(s) V1 No previous ECGs available Confirmed by Theotis Burrow [phone]) on 04/14/2019 1:26:12 PM   Radiology Dg Chest 2 View  Result Date: 04/14/2019 CLINICAL DATA:  Chest pain for 2 weeks. EXAM: CHEST - 2 VIEW COMPARISON:  07/01/2011 FINDINGS: The heart size and mediastinal contours are within normal limits. Both lungs are clear. The visualized skeletal structures are unremarkable. IMPRESSION: No active cardiopulmonary disease. Electronically Signed   By: Kerby Moors M.D.   On: 04/14/2019 13:56    Procedures Procedures (including critical care time)  Medications Ordered in ED Medications - No data to display   Initial Impression / Assessment and Plan / ED Course  I have reviewed the triage vital signs and the nursing notes.  Pertinent labs & imaging results that were available during my care of the patient were reviewed by me and considered in my medical decision making (see chart for details).       Well appearing and comfortable, pain currently  mild. Very mildly tachycardic, remainder of VS stable. EKG without ischemic changes. DDX includes ACS, PE, GERD, gallbladder disease. No hx suggestive of aortic dissection.  CXR normal. Labs show normal CMP, negative trop and d-dimer. Because CP has been intermittent for a month and constant since this morning, I feel single troponin is sufficient. Pt remains well appearing on reassessment. Her HEART score is 2 and given sx have been going on for several weeks, I feel that ACS is unlikely and she is safe for PCP f/u of symptoms. I  have extensively reviewed return precautions and she voiced understanding.  Final Clinical Impressions(s) / ED Diagnoses   Final diagnoses:  Atypical chest pain    ED Discharge Orders    None       Toriano Aikey, Wenda Overland, MD 04/14/19 1450

## 2019-08-03 DIAGNOSIS — Z20828 Contact with and (suspected) exposure to other viral communicable diseases: Secondary | ICD-10-CM | POA: Diagnosis not present

## 2019-08-30 DIAGNOSIS — Z85828 Personal history of other malignant neoplasm of skin: Secondary | ICD-10-CM | POA: Diagnosis not present

## 2019-08-30 DIAGNOSIS — L814 Other melanin hyperpigmentation: Secondary | ICD-10-CM | POA: Diagnosis not present

## 2019-08-30 DIAGNOSIS — L570 Actinic keratosis: Secondary | ICD-10-CM | POA: Diagnosis not present

## 2019-08-30 DIAGNOSIS — D2272 Melanocytic nevi of left lower limb, including hip: Secondary | ICD-10-CM | POA: Diagnosis not present

## 2019-08-30 DIAGNOSIS — D225 Melanocytic nevi of trunk: Secondary | ICD-10-CM | POA: Diagnosis not present

## 2019-11-16 DIAGNOSIS — Z20828 Contact with and (suspected) exposure to other viral communicable diseases: Secondary | ICD-10-CM | POA: Diagnosis not present

## 2019-11-21 ENCOUNTER — Telehealth: Payer: Self-pay

## 2019-11-21 DIAGNOSIS — U071 COVID-19: Secondary | ICD-10-CM

## 2019-11-21 MED ORDER — DEXAMETHASONE 6 MG PO TABS: 6.0000 mg | ORAL_TABLET | Freq: Every day | ORAL | 0 refills | Status: DC

## 2019-11-21 NOTE — Telephone Encounter (Signed)
Rx sent for decadron (steroid like prednisone) but preferred in covid treatment

## 2019-11-21 NOTE — Telephone Encounter (Signed)
Copied from [place] [id number]. Topic: General - Other >> Nov 21, 2019 11:53 AM Greggory Keen D wrote: Reason for CRM: pt tested positive for Covid yesterday.   She has been having symptoms since the 18th.  She is a little tight in her chest.  Sinus issues, headache, no fever a little achey. She is asking if Dr. Bryan Lemma would prescribe a dose of prednisone.  She said her friend was prescribe prednisone and it helped her.  CB#  [phone]

## 2019-11-22 ENCOUNTER — Encounter: Payer: Self-pay | Admitting: Family Medicine

## 2019-11-24 ENCOUNTER — Other Ambulatory Visit: Payer: Self-pay | Admitting: Family Medicine

## 2019-11-24 DIAGNOSIS — J301 Allergic rhinitis due to pollen: Secondary | ICD-10-CM

## 2019-11-24 DIAGNOSIS — F4329 Adjustment disorder with other symptoms: Secondary | ICD-10-CM

## 2019-12-22 DIAGNOSIS — Z9013 Acquired absence of bilateral breasts and nipples: Secondary | ICD-10-CM | POA: Diagnosis not present

## 2019-12-28 DIAGNOSIS — N952 Postmenopausal atrophic vaginitis: Secondary | ICD-10-CM | POA: Diagnosis not present

## 2019-12-28 DIAGNOSIS — Z1501 Genetic susceptibility to malignant neoplasm of breast: Secondary | ICD-10-CM | POA: Diagnosis not present

## 2019-12-28 DIAGNOSIS — Z1509 Genetic susceptibility to other malignant neoplasm: Secondary | ICD-10-CM | POA: Diagnosis not present

## 2019-12-28 DIAGNOSIS — Z01411 Encounter for gynecological examination (general) (routine) with abnormal findings: Secondary | ICD-10-CM | POA: Diagnosis not present

## 2020-01-16 ENCOUNTER — Telehealth: Payer: Self-pay | Admitting: Family Medicine

## 2020-01-16 NOTE — Telephone Encounter (Signed)
Lvm for pt to call back about message sent:  Appointment Request From: Dorothyann Peng    With Provider: Letta Median, DO [[facility]]    Preferred Date Range: 01/22/2020 - 01/25/2020    Preferred Times: Any Time    Reason for visit: Annual Physical    Comments:  Yearly check up/blood work    Pt last physcical 02/08/2019

## 2020-01-17 DIAGNOSIS — L820 Inflamed seborrheic keratosis: Secondary | ICD-10-CM | POA: Diagnosis not present

## 2020-02-08 ENCOUNTER — Other Ambulatory Visit: Payer: Self-pay

## 2020-02-08 NOTE — Patient Instructions (Signed)
Health Maintenance, Female Adopting a healthy lifestyle and getting preventive care are important in promoting health and wellness. Ask your health care provider about:  The right schedule for you to have regular tests and exams.  Things you can do on your own to prevent diseases and keep yourself healthy. What should I know about diet, weight, and exercise? Eat a healthy diet   Eat a diet that includes plenty of vegetables, fruits, low-fat dairy products, and lean protein.  Do not eat a lot of foods that are high in solid fats, added sugars, or sodium. Maintain a healthy weight Body mass index (BMI) is used to identify weight problems. It estimates body fat based on height and weight. Your health care provider can help determine your BMI and help you achieve or maintain a healthy weight. Get regular exercise Get regular exercise. This is one of the most important things you can do for your health. Most adults should:  Exercise for at least 150 minutes each week. The exercise should increase your heart rate and make you sweat (moderate-intensity exercise).  Do strengthening exercises at least twice a week. This is in addition to the moderate-intensity exercise.  Spend less time sitting. Even light physical activity can be beneficial. Watch cholesterol and blood lipids Have your blood tested for lipids and cholesterol at 51 years of age, then have this test every 5 years. Have your cholesterol levels checked more often if:  Your lipid or cholesterol levels are high.  You are older than 51 years of age.  You are at high risk for heart disease. What should I know about cancer screening? Depending on your health history and family history, you may need to have cancer screening at various ages. This may include screening for:  Breast cancer.  Cervical cancer.  Colorectal cancer.  Skin cancer.  Lung cancer. What should I know about heart disease, diabetes, and high blood  pressure? Blood pressure and heart disease  High blood pressure causes heart disease and increases the risk of stroke. This is more likely to develop in people who have high blood pressure readings, are of African descent, or are overweight.  Have your blood pressure checked: ? Every 3-5 years if you are 18-39 years of age. ? Every year if you are 40 years old or older. Diabetes Have regular diabetes screenings. This checks your fasting blood sugar level. Have the screening done:  Once every three years after age 40 if you are at a normal weight and have a low risk for diabetes.  More often and at a younger age if you are overweight or have a high risk for diabetes. What should I know about preventing infection? Hepatitis B If you have a higher risk for hepatitis B, you should be screened for this virus. Talk with your health care provider to find out if you are at risk for hepatitis B infection. Hepatitis C Testing is recommended for:  Everyone born from 1945 through 1965.  Anyone with known risk factors for hepatitis C. Sexually transmitted infections (STIs)  Get screened for STIs, including gonorrhea and chlamydia, if: ? You are sexually active and are younger than 51 years of age. ? You are older than 51 years of age and your health care provider tells you that you are at risk for this type of infection. ? Your sexual activity has changed since you were last screened, and you are at increased risk for chlamydia or gonorrhea. Ask your health care provider if   you are at risk.  Ask your health care provider about whether you are at high risk for HIV. Your health care provider may recommend a prescription medicine to help prevent HIV infection. If you choose to take medicine to prevent HIV, you should first get tested for HIV. You should then be tested every 3 months for as long as you are taking the medicine. Pregnancy  If you are about to stop having your period (premenopausal) and  you may become pregnant, seek counseling before you get pregnant.  Take 400 to 800 micrograms (mcg) of folic acid every day if you become pregnant.  Ask for birth control (contraception) if you want to prevent pregnancy. Osteoporosis and menopause Osteoporosis is a disease in which the bones lose minerals and strength with aging. This can result in bone fractures. If you are 65 years old or older, or if you are at risk for osteoporosis and fractures, ask your health care provider if you should:  Be screened for bone loss.  Take a calcium or vitamin D supplement to lower your risk of fractures.  Be given hormone replacement therapy (HRT) to treat symptoms of menopause. Follow these instructions at home: Lifestyle  Do not use any products that contain nicotine or tobacco, such as cigarettes, e-cigarettes, and chewing tobacco. If you need help quitting, ask your health care provider.  Do not use street drugs.  Do not share needles.  Ask your health care provider for help if you need support or information about quitting drugs. Alcohol use  Do not drink alcohol if: ? Your health care provider tells you not to drink. ? You are pregnant, may be pregnant, or are planning to become pregnant.  If you drink alcohol: ? Limit how much you use to 0-1 drink a day. ? Limit intake if you are breastfeeding.  Be aware of how much alcohol is in your drink. In the [place] one drink equals one 12 oz bottle of beer (355 mL), one 5 oz glass of wine (148 mL), or one 1 oz glass of hard liquor (44 mL). General instructions  Schedule regular health, dental, and eye exams.  Stay current with your vaccines.  Tell your health care provider if: ? You often feel depressed. ? You have ever been abused or do not feel safe at home. Summary  Adopting a healthy lifestyle and getting preventive care are important in promoting health and wellness.  Follow your health care provider's instructions about healthy  diet, exercising, and getting tested or screened for diseases.  Follow your health care provider's instructions on monitoring your cholesterol and blood pressure. This information is not intended to replace advice given to you by your health care provider. Make sure you discuss any questions you have with your health care provider. Document Revised: 11/02/2018 Document Reviewed: 11/02/2018 Elsevier Patient Education  2020 Elsevier Inc.  

## 2020-02-09 ENCOUNTER — Ambulatory Visit (INDEPENDENT_AMBULATORY_CARE_PROVIDER_SITE_OTHER): Payer: BC Managed Care – PPO | Admitting: Family Medicine

## 2020-02-09 ENCOUNTER — Encounter: Payer: Self-pay | Admitting: Family Medicine

## 2020-02-09 VITALS — BP 112/72 | HR 80 | Temp 97.0°F | Ht 60.0 in | Wt 114.4 lb

## 2020-02-09 DIAGNOSIS — R399 Unspecified symptoms and signs involving the genitourinary system: Secondary | ICD-10-CM

## 2020-02-09 DIAGNOSIS — F4329 Adjustment disorder with other symptoms: Secondary | ICD-10-CM | POA: Diagnosis not present

## 2020-02-09 DIAGNOSIS — Z1211 Encounter for screening for malignant neoplasm of colon: Secondary | ICD-10-CM

## 2020-02-09 DIAGNOSIS — Z1501 Genetic susceptibility to malignant neoplasm of breast: Secondary | ICD-10-CM | POA: Diagnosis not present

## 2020-02-09 DIAGNOSIS — Z1509 Genetic susceptibility to other malignant neoplasm: Secondary | ICD-10-CM

## 2020-02-09 DIAGNOSIS — Z0000 Encounter for general adult medical examination without abnormal findings: Secondary | ICD-10-CM

## 2020-02-09 LAB — POCT URINALYSIS DIPSTICK
Bilirubin, UA: NEGATIVE
Glucose, UA: NEGATIVE
Ketones, UA: NEGATIVE
Leukocytes, UA: NEGATIVE
Nitrite, UA: NEGATIVE
Protein, UA: POSITIVE — AB
Spec Grav, UA: 1.025 (ref 1.010–1.025)
Urobilinogen, UA: 0.2 E.U./dL
pH, UA: 6 (ref 5.0–8.0)

## 2020-02-09 LAB — CBC
HCT: 36.1 % (ref 36.0–46.0)
Hemoglobin: 12.1 g/dL (ref 12.0–15.0)
MCHC: 33.5 g/dL (ref 30.0–36.0)
MCV: 92.9 fl (ref 78.0–100.0)
Platelets: 362 10*3/uL (ref 150.0–400.0)
RBC: 3.89 Mil/uL (ref 3.87–5.11)
RDW: 12.7 % (ref 11.5–15.5)
WBC: 4.8 10*3/uL (ref 4.0–10.5)

## 2020-02-09 LAB — BASIC METABOLIC PANEL
BUN: 18 mg/dL (ref 6–23)
CO2: 30 mEq/L (ref 19–32)
Calcium: 9.6 mg/dL (ref 8.4–10.5)
Chloride: 104 mEq/L (ref 96–112)
Creatinine, Ser: 0.61 mg/dL (ref 0.40–1.20)
GFR: 103.48 mL/min (ref >60.00)
Glucose, Bld: 99 mg/dL (ref 70–99)
Potassium: 5.3 mEq/L — ABNORMAL HIGH (ref 3.5–5.1)
Sodium: 140 mEq/L (ref 135–145)

## 2020-02-09 LAB — LIPID PANEL
Cholesterol: 232 mg/dL — ABNORMAL HIGH (ref 0–200)
HDL: 72 mg/dL (ref >39.00)
LDL Cholesterol: 149 mg/dL — ABNORMAL HIGH (ref 0–99)
NonHDL: 159.87
Total CHOL/HDL Ratio: 3
Triglycerides: 53 mg/dL (ref 0.0–149.0)
VLDL: 10.6 mg/dL (ref 0.0–40.0)

## 2020-02-09 LAB — ALT: ALT: 18 U/L (ref 0–35)

## 2020-02-09 LAB — AST: AST: 14 U/L (ref 0–37)

## 2020-02-09 LAB — VITAMIN D 25 HYDROXY (VIT D DEFICIENCY, FRACTURES): VITD: 46.74 ng/mL (ref 30.00–100.00)

## 2020-02-09 MED ORDER — NITROFURANTOIN MONOHYD MACRO 100 MG PO CAPS: 100.0000 mg | ORAL_CAPSULE | Freq: Two times a day (BID) | ORAL | 0 refills | Status: AC

## 2020-02-09 NOTE — Progress Notes (Signed)
Tammy Gilmore is a 49 y.o. female  Chief Complaint  Patient presents with  . Annual Exam    Pt c/o possible UTI starting yesterday,  Burning, frequent, and discomfort.  Pt is fasting for labs today. Pt will like to get Col[place]ard.  Pt is UTD on pap.    HPI: Tammy Gilmore is a 49 y.o. female here for annual CPE, fasting labs Pt is s/p B/L mastectomy in 2011 and hysterectomy. She is BRCA-2 +. She follows with Gyn Onc at [facility] as well as Plastic & Recon Surgery at [facility]. Pt complains of dysuria, urinary frequency, low abd discomfort that began yesterday. No fever, chills, n/v, back pain. No gross hematuria. She took 3 doses of AZO yesterday.  Last colonoscopy: pt requests cologuard Last PAP: follows with GYN  Diet/Exercise: overall healthy, regular exercise Dental: UTD Vision: due for eye exam    Past Medical History:  Diagnosis Date  . Breast cancer [facility])     Past Surgical History:  Procedure Laterality Date  . ABDOMINAL HYSTERECTOMY     ovaries and fallopian tubes  . double masectomy  2011  . MASTECTOMY      Social History   Socioeconomic History  . Marital status: Married    Spouse name: Not on file  . Number of children: Not on file  . Years of education: Not on file  . Highest education level: Not on file  Occupational History  . Not on file  Tobacco Use  . Smoking status: Never Smoker  . Smokeless tobacco: Never Used  Substance and Sexual Activity  . Alcohol use: Yes    Comment: occ  . Drug use: Never  . Sexual activity: Not on file  Other Topics Concern  . Not on file  Social History Narrative  . Not on file   Social Determinants of Health   Financial Resource Strain:   . Difficulty of Paying Living Expenses:   Food Insecurity:   . Worried About Charity fundraiser in the Last Year:   . Arboriculturist in the Last Year:   Transportation Needs:   . Film/video editor (Medical):   Marland Kitchen Lack of Transportation (Non-Medical):     Physical Activity:   . Days of Exercise per Week:   . Minutes of Exercise per Session:   Stress:   . Feeling of Stress :   Social Connections:   . Frequency of Communication with Friends and Family:   . Frequency of Social Gatherings with Friends and Family:   . Attends Religious Services:   . Active Member of Clubs or Organizations:   . Attends Archivist Meetings:   Marland Kitchen Marital Status:   Intimate Partner Violence:   . Fear of Current or Ex-Partner:   . Emotionally Abused:   Marland Kitchen Physically Abused:   . Sexually Abused:     Family History  Problem Relation Age of Onset  . Cancer Father        lung  . Cancer Brother        gall bladder  . Breast cancer Paternal Grandmother      Immunization History  Administered Date(s) Administered  . Influenza, Quadrivalent, Recombinant, Inj, Pf 10/03/2018  . Influenza,inj,Quad PF,6+ Mos 11/14/2015  . Tdap 01/25/2019    Outpatient Encounter Medications as of 02/09/2020  Medication Sig  . hydrOXYzine (ATARAX/VISTARIL) 10 MG tablet TAKE 1 TABLET(10 MG) BY MOUTH THREE TIMES DAILY AS NEEDED  . cyclobenzaprine (FLEXERIL)  10 MG tablet TK 0.5-1 TS PO NIGHTLY. APPROXIMATELY ONE PRIOR TO BEDTIME  . Multiple Vitamin (MULTI-VITAMIN) tablet Take by mouth.  . nitrofurantoin, macrocrystal-monohydrate, (MACROBID) 100 MG capsule Take 1 capsule (100 mg total) by mouth 2 (two) times daily for 7 days.  . [DISCONTINUED] dexamethasone (DECADRON) 6 MG tablet Take 1 tablet (6 mg total) by mouth daily.   No facility-administered encounter medications on file as of 02/09/2020.     ROS: Gen: no fever, chills  Skin: no rash, itching ENT: no ear pain, ear drainage, nasal congestion, rhinorrhea, sinus pressure, sore throat Eyes: no blurry vision, double vision Resp: no cough, wheeze,SOB CV: no CP, palpitations, LE edema,  GI: no heartburn, n/v/d/c, abd pain GU: as above in HPI MSK: no joint pain, myalgias, back pain Neuro: no dizziness, headache,  weakness Psych: no depression, + anxiety, no insomnia   No Known Allergies  Pulse 80   Temp (!) 97 F (36.1 C) (Temporal)   Ht 5' (1.524 m)   Wt 114 lb 6.4 oz (51.9 kg)   SpO2 99%   BMI 22.34 kg/m  BP Readings from Last 3 Encounters:  04/14/19 98/61  02/08/19 108/72  01/25/19 130/80    Physical Exam  Constitutional: She is oriented to person, place, and time. She appears well-developed and well-nourished. No distress.  HENT:  Head: Normocephalic and atraumatic.  Right Ear: Tympanic membrane and ear canal normal.  Left Ear: Tympanic membrane and ear canal normal.  Nose: Nose normal.  Mouth/Throat: Oropharynx is clear and moist and mucous membranes are normal.  Eyes: Pupils are equal, round, and reactive to light. Conjunctivae are normal.  Neck: No thyromegaly present.  Cardiovascular: Normal rate, regular rhythm, normal heart sounds and intact distal pulses.  No murmur heard. Pulmonary/Chest: Effort normal and breath sounds normal. No respiratory distress. She has no wheezes. She has no rhonchi.  Abdominal: Soft. Bowel sounds are normal. She exhibits no distension and no mass. There is no abdominal tenderness.  Musculoskeletal:        General: No edema.     Cervical back: Neck supple.  Lymphadenopathy:    She has no cervical adenopathy.  Neurological: She is alert and oriented to person, place, and time. She exhibits normal muscle tone. Coordination normal.  Skin: Skin is warm and dry.  Psychiatric: She has a normal mood and affect. Her behavior is normal.     A/P:  1. Annual physical exam - UTD on PAP, pt requests cologuard - UTD on dental exam, due for dental exam - discussed importance of regular CV exercise, healthy diet, adequate sleep - immunizations UTD - ALT - AST - Basic metabolic panel - VITAMIN D 25 Hydroxy (Vit-D Deficiency, Fractures) - Lipid panel - CBC - next CPE in 1 year  2. BRCA2 positive - s/p B/L mastectomy and hysterectomy  3. Stress  and adjustment reaction - pt takes hydroxyzine PRN - does not need refill - stable  4. Screening for colon cancer - pt requests Cologuard  5. UTI symptoms - POCT Urinalysis Dipstick - Urine Culture Rx: - nitrofurantoin, macrocrystal-monohydrate, (MACROBID) 100 MG capsule; Take 1 capsule (100 mg total) by mouth 2 (two) times daily for 7 days.  Dispense: 14 capsule; Refill: 0 Discussed plan and reviewed medications with patient, including risks, benefits, and potential side effects. Pt expressed understand. All questions answered.    This visit occurred during the SARS-CoV-2 public health emergency.  Safety protocols were in place, including screening questions prior to the visit,  additional usage of staff PPE, and extensive cleaning of exam room while observing appropriate contact time as indicated for disinfecting solutions.

## 2020-02-10 LAB — URINE CULTURE
MICRO NUMBER:: 10271092
Result:: NO GROWTH
SPECIMEN QUALITY:: ADEQUATE

## 2020-02-15 ENCOUNTER — Encounter: Payer: Self-pay | Admitting: Family Medicine

## 2020-02-23 ENCOUNTER — Ambulatory Visit: Payer: BC Managed Care – PPO | Attending: Internal Medicine

## 2020-02-23 DIAGNOSIS — Z23 Encounter for immunization: Secondary | ICD-10-CM

## 2020-02-23 NOTE — Progress Notes (Signed)
   Covid-19 Vaccination Clinic  Name:  Tammy Gilmore    MRN: [id number] DOB: 1970/12/06  02/23/2020  [name] was observed post Covid-19 immunization for 15 minutes without incident. She was provided with Vaccine Information Sheet and instruction to access the V-Safe system.   [name] was instructed to call 911 with any severe reactions post vaccine: Marland Kitchen Difficulty breathing  . Swelling of face and throat  . A fast heartbeat  . A bad rash all over body  . Dizziness and weakness   Immunizations Administered    Name Date Dose VIS Date Route   Pfizer COVID-19 Vaccine 02/23/2020 10:20 AM 0.3 mL 11/03/2019 Intramuscular   Manufacturer: [employer]   Lot: [id number]   [place]: [id number]

## 2020-03-03 ENCOUNTER — Encounter: Payer: Self-pay | Admitting: Family Medicine

## 2020-03-04 ENCOUNTER — Other Ambulatory Visit: Payer: Self-pay

## 2020-03-04 ENCOUNTER — Emergency Department (HOSPITAL_BASED_OUTPATIENT_CLINIC_OR_DEPARTMENT_OTHER): Payer: BC Managed Care – PPO

## 2020-03-04 ENCOUNTER — Emergency Department (HOSPITAL_BASED_OUTPATIENT_CLINIC_OR_DEPARTMENT_OTHER)
Admission: EM | Admit: 2020-03-04 | Discharge: 2020-03-04 | Disposition: A | Discharge status: Home / Self Care | Payer: BC Managed Care – PPO | Attending: Emergency Medicine | Admitting: Emergency Medicine

## 2020-03-04 ENCOUNTER — Encounter (HOSPITAL_BASED_OUTPATIENT_CLINIC_OR_DEPARTMENT_OTHER): Payer: Self-pay

## 2020-03-04 DIAGNOSIS — R202 Paresthesia of skin: Secondary | ICD-10-CM | POA: Insufficient documentation

## 2020-03-04 DIAGNOSIS — Z853 Personal history of malignant neoplasm of breast: Secondary | ICD-10-CM | POA: Insufficient documentation

## 2020-03-04 DIAGNOSIS — R519 Headache, unspecified: Secondary | ICD-10-CM | POA: Insufficient documentation

## 2020-03-04 LAB — CBC WITH DIFFERENTIAL/PLATELET
Abs Immature Granulocytes: 0.02 10*3/uL (ref 0.00–0.07)
Basophils Absolute: 0 10*3/uL (ref 0.0–0.1)
Basophils Relative: 0 %
Eosinophils Absolute: 0 10*3/uL (ref 0.0–0.5)
Eosinophils Relative: 0 %
HCT: 38.7 % (ref 36.0–46.0)
Hemoglobin: 12.6 g/dL (ref 12.0–15.0)
Immature Granulocytes: 0 %
Lymphocytes Relative: 25 %
Lymphs Abs: 1.7 10*3/uL (ref 0.7–4.0)
MCH: 30.9 pg (ref 26.0–34.0)
MCHC: 32.6 g/dL (ref 30.0–36.0)
MCV: 94.9 fL (ref 80.0–100.0)
Monocytes Absolute: 0.4 10*3/uL (ref 0.1–1.0)
Monocytes Relative: 5 %
Neutro Abs: 4.7 10*3/uL (ref 1.7–7.7)
Neutrophils Relative %: 70 %
Platelets: 420 10*3/uL — ABNORMAL HIGH (ref 150–400)
RBC: 4.08 MIL/uL (ref 3.87–5.11)
RDW: 11.8 % (ref 11.5–15.5)
WBC: 6.8 10*3/uL (ref 4.0–10.5)
nRBC: 0 % (ref 0.0–0.2)

## 2020-03-04 LAB — BASIC METABOLIC PANEL
Anion gap: 11 (ref 5–15)
BUN: 11 mg/dL (ref 6–20)
CO2: 27 mmol/L (ref 22–32)
Calcium: 9.8 mg/dL (ref 8.9–10.3)
Chloride: 102 mmol/L (ref 98–111)
Creatinine, Ser: 0.57 mg/dL (ref 0.44–1.00)
GFR calc Af Amer: >60 mL/min (ref >60)
GFR calc non Af Amer: >60 mL/min (ref >60)
Glucose, Bld: 110 mg/dL — ABNORMAL HIGH (ref 70–99)
Potassium: 3.9 mmol/L (ref 3.5–5.1)
Sodium: 140 mmol/L (ref 135–145)

## 2020-03-04 MED ORDER — PROCHLORPERAZINE EDISYLATE 10 MG/2ML IJ SOLN
10.0000 mg | Freq: Once | INTRAMUSCULAR | Status: AC
  Administered 2020-03-04: 13:00:00 10 mg via INTRAVENOUS
  Filled 2020-03-04: qty 2

## 2020-03-04 MED ORDER — KETOROLAC TROMETHAMINE 30 MG/ML IJ SOLN
30.0000 mg | Freq: Once | INTRAMUSCULAR | Status: AC
  Administered 2020-03-04: 30 mg via INTRAVENOUS
  Filled 2020-03-04: qty 1

## 2020-03-04 NOTE — Telephone Encounter (Signed)
I called pt to follow up and to maybe schedule an office visit. Pt informed me that this morning she woke up with a headache and also that she felt a sharpe pain going up her arm, so pt proceeded to Urgent Care. Pt explained that she was currently there waiting for them to do a CT scan.  I informed pt that I would send message to Dr. Loletha Grayer to let her know.

## 2020-03-04 NOTE — ED Triage Notes (Signed)
Pt states that she woke up this morning and had some tingling in her left arm, reports she noticed it when she started moving around 8 am.  reports it "feels asleep" also c/o busted blood vessel in left eye and headache  (Saturday morning). Did have Covid vaccine 10 days ago.

## 2020-03-04 NOTE — Discharge Instructions (Signed)
Follow-up with your primary care doctor to be rechecked.  Return to emergency room if start having difficulty with speech vision or other neurologic complaints as we discussed

## 2020-03-04 NOTE — ED Provider Notes (Signed)
[facility] EMERGENCY DEPARTMENT Provider Note   CSN: [id number] Arrival date & time: 03/04/20  1037     History Chief Complaint  Patient presents with  . Tingling    Tammy Gilmore is a 49 y.o. female.  HPI   Patient presents to the ED for evaluation of headache and tingling in her left arm.  Patient states she started having headache Saturday morning.  Patient did notice when she woke up that she had a small subconjunctival hemorrhage but is not having any eye pain or blurred vision.  The headache persisted and usually she does not get any headaches.  This morning when she woke up she felt some tingling in her left arm.  She is not having any weakness.  She is not specifically having any numbness.  Patient denies any trouble with her speech.  No trouble with her balance or coordination.  Past Medical History:  Diagnosis Date  . Breast cancer [facility])     Patient Active Problem List   Diagnosis Date Noted  . Seasonal allergic rhinitis due to pollen 03/02/2019  . Stress and adjustment reaction 03/02/2019  . Status post bilateral salpingo-oophorectomy 12/17/2018  . Surgical menopause 02/14/2014  . Genetic susceptibility to breast cancer 02/09/2014  . BRCA2 positive 02/27/2013  . Status post bilateral mastectomy 02/27/2013    Past Surgical History:  Procedure Laterality Date  . ABDOMINAL HYSTERECTOMY     ovaries and fallopian tubes  . double masectomy  2011  . MASTECTOMY       OB History   No obstetric history on file.     Family History  Problem Relation Age of Onset  . Cancer Father        lung  . Cancer Brother        gall bladder  . Breast cancer Paternal Grandmother     Social History   Tobacco Use  . Smoking status: Never Smoker  . Smokeless tobacco: Never Used  Substance Use Topics  . Alcohol use: Yes    Comment: occ  . Drug use: Never    Home Medications Prior to Admission medications   Medication Sig Start Date End Date Taking?  Authorizing Provider  cyclobenzaprine (FLEXERIL) 10 MG tablet TK 0.5-1 TS PO NIGHTLY. APPROXIMATELY ONE PRIOR TO BEDTIME 10/03/19   [provider]  hydrOXYzine (ATARAX/VISTARIL) 10 MG tablet TAKE 1 TABLET(10 MG) BY MOUTH THREE TIMES DAILY AS NEEDED 11/29/19   Cirigliano, Garvin Fila, DO  Multiple Vitamin (MULTI-VITAMIN) tablet Take by mouth.    [provider]    Allergies    Patient has no known allergies.  Review of Systems   Review of Systems  All other systems reviewed and are negative.   Physical Exam Updated Vital Signs BP 114/73   Pulse 71   Temp 98.8 F (37.1 C) (Oral)   Resp 16   Ht 1.524 m (5')   Wt 51.7 kg   SpO2 98%   BMI 22.26 kg/m   Physical Exam Vitals and nursing note reviewed.  Constitutional:      General: She is not in acute distress.    Appearance: She is well-developed.  HENT:     Head: Normocephalic and atraumatic.     Right Ear: External ear normal.     Left Ear: External ear normal.  Eyes:     General: No scleral icterus.       Right eye: No discharge.        Left eye: No  discharge.     Conjunctiva/sclera: Conjunctivae normal.  Neck:     Trachea: No tracheal deviation.  Cardiovascular:     Rate and Rhythm: Normal rate and regular rhythm.  Pulmonary:     Effort: Pulmonary effort is normal. No respiratory distress.     Breath sounds: Normal breath sounds. No stridor. No wheezing or rales.  Abdominal:     General: Bowel sounds are normal. There is no distension.     Palpations: Abdomen is soft.     Tenderness: There is no abdominal tenderness. There is no guarding or rebound.  Musculoskeletal:        General: No tenderness.     Cervical back: Neck supple.  Skin:    General: Skin is warm and dry.     Findings: No rash.  Neurological:     Mental Status: She is alert and oriented to person, place, and time.     Cranial Nerves: No cranial nerve deficit (No facial droop, extraocular movements intact, tongue midline ).      Sensory: No sensory deficit.     Motor: No abnormal muscle tone or seizure activity.     Coordination: Coordination normal.     Comments: No pronator drift bilateral upper extrem, able to hold both legs off bed for 5 seconds, sensation intact in all extremities, no visual field cuts, no left or right sided neglect, normal finger-nose exam bilaterally, no nystagmus noted      ED Results / Procedures / Treatments   Labs (all labs ordered are listed, but only abnormal results are displayed) Labs Reviewed  CBC WITH DIFFERENTIAL/PLATELET - Abnormal; Notable for the following components:      Result Value   Platelets 420 (*)    All other components within normal limits  BASIC METABOLIC PANEL - Abnormal; Notable for the following components:   Glucose, Bld 110 (*)    All other components within normal limits    EKG None  Radiology CT HEAD WO CONTRAST  Result Date: 03/04/2020 CLINICAL DATA:  Headache.  Normal neuro exam.  Tingling left arm EXAM: CT HEAD WITHOUT CONTRAST TECHNIQUE: Contiguous axial images were obtained from the base of the skull through the vertex without intravenous contrast. COMPARISON:  None. FINDINGS: Brain: No evidence of acute infarction, hemorrhage, hydrocephalus, extra-axial collection or mass lesion/mass effect. Vascular: Negative for hyperdense vessel Skull: Negative Sinuses/Orbits: Negative Other: None IMPRESSION: Normal CT head Electronically Signed   By: Franchot Gallo M.D.   On: 03/04/2020 11:34    Procedures Procedures (including critical care time)  Medications Ordered in ED Medications  ketorolac (TORADOL) 30 MG/ML injection 30 mg (30 mg Intravenous Given 03/04/20 1242)  prochlorperazine (COMPAZINE) injection 10 mg (10 mg Intravenous Given 03/04/20 1246)    ED Course  I have reviewed the triage vital signs and the nursing notes.  Pertinent labs & imaging results that were available during my care of the patient were reviewed by me and considered in my  medical decision making (see chart for details).    MDM Rules/Calculators/A&P                      Patient presented to the ED for evaluation of headache and paresthesias.  Laboratory tests and CT scan are reassuring.  No signs of mass tumor or bleed.  Neurologic exam is reassuring.  NIH stroke is 0.  Discussed with the patient that I cannot completely exclude the possibility of a stroke but overall I have low  suspicion.  She was given a migraine cocktail and her symptoms have all resolved.  Is not having headache now and the paresthesias have been resolved.  This point I do not feel that she needs to have an MRI.  Strict precautions were discussed. Final Clinical Impression(s) / ED Diagnoses Final diagnoses:  Paresthesia  Acute nonintractable headache, unspecified headache type    Rx / DC Orders ED Discharge Orders    None       Dorie Rank, MD 03/04/20 1420

## 2020-03-04 NOTE — ED Notes (Signed)
Patient is resting comfortably.waiting on results from bloodwork

## 2020-03-20 ENCOUNTER — Ambulatory Visit: Payer: BC Managed Care – PPO

## 2020-03-21 ENCOUNTER — Other Ambulatory Visit: Payer: Self-pay

## 2020-03-21 ENCOUNTER — Encounter: Payer: Self-pay | Admitting: Family Medicine

## 2020-03-21 DIAGNOSIS — H1130 Conjunctival hemorrhage, unspecified eye: Secondary | ICD-10-CM

## 2020-03-22 ENCOUNTER — Encounter: Payer: Self-pay | Admitting: Family Medicine

## 2020-03-22 ENCOUNTER — Other Ambulatory Visit (INDEPENDENT_AMBULATORY_CARE_PROVIDER_SITE_OTHER): Payer: BC Managed Care – PPO

## 2020-03-22 DIAGNOSIS — H1130 Conjunctival hemorrhage, unspecified eye: Secondary | ICD-10-CM

## 2020-03-22 LAB — ALT: ALT: 23 U/L (ref 0–35)

## 2020-03-22 LAB — CBC
HCT: 34.7 % — ABNORMAL LOW (ref 36.0–46.0)
Hemoglobin: 11.7 g/dL — ABNORMAL LOW (ref 12.0–15.0)
MCHC: 33.8 g/dL (ref 30.0–36.0)
MCV: 93.5 fl (ref 78.0–100.0)
Platelets: 351 10*3/uL (ref 150.0–400.0)
RBC: 3.71 Mil/uL — ABNORMAL LOW (ref 3.87–5.11)
RDW: 12.2 % (ref 11.5–15.5)
WBC: 5.3 10*3/uL (ref 4.0–10.5)

## 2020-03-22 LAB — AST: AST: 14 U/L (ref 0–37)

## 2020-03-22 LAB — PROTIME-INR
INR: 0.9 ratio (ref 0.8–1.0)
Prothrombin Time: 10.4 s (ref 9.6–13.1)

## 2020-03-27 ENCOUNTER — Ambulatory Visit: Payer: BC Managed Care – PPO | Attending: Internal Medicine

## 2020-03-27 DIAGNOSIS — Z23 Encounter for immunization: Secondary | ICD-10-CM

## 2020-03-27 NOTE — Progress Notes (Signed)
   Covid-19 Vaccination Clinic  Name:  Tammy Gilmore    MRN: [id number] DOB: 1970/12/15  03/27/2020  Tammy Gilmore was observed post Covid-19 immunization for 15 minutes without incident. She was provided with Vaccine Information Sheet and instruction to access the V-Safe system.   Tammy Gilmore was instructed to call 911 with any severe reactions post vaccine: Marland Kitchen Difficulty breathing  . Swelling of face and throat  . A fast heartbeat  . A bad rash all over body  . Dizziness and weakness   Immunizations Administered    Name Date Dose VIS Date Route   Pfizer COVID-19 Vaccine 03/27/2020  9:58 AM 0.3 mL 01/17/2019 Intramuscular   Manufacturer: [place]   Lot: [id number]   [place]: [id number]

## 2020-03-28 NOTE — Telephone Encounter (Signed)
Completed and faxed off.

## 2020-04-08 DIAGNOSIS — Z1212 Encounter for screening for malignant neoplasm of rectum: Secondary | ICD-10-CM | POA: Diagnosis not present

## 2020-04-08 DIAGNOSIS — Z1211 Encounter for screening for malignant neoplasm of colon: Secondary | ICD-10-CM | POA: Diagnosis not present

## 2020-04-13 LAB — COLOGUARD
COLOGUARD: NEGATIVE
Cologuard: NEGATIVE

## 2020-04-13 LAB — EXTERNAL GENERIC LAB PROCEDURE: COLOGUARD: NEGATIVE

## 2020-05-22 DIAGNOSIS — B370 Candidal stomatitis: Secondary | ICD-10-CM | POA: Diagnosis not present

## 2020-05-22 DIAGNOSIS — Z1152 Encounter for screening for COVID-19: Secondary | ICD-10-CM | POA: Diagnosis not present

## 2020-05-22 DIAGNOSIS — J3089 Other allergic rhinitis: Secondary | ICD-10-CM | POA: Diagnosis not present

## 2020-05-22 DIAGNOSIS — J019 Acute sinusitis, unspecified: Secondary | ICD-10-CM | POA: Diagnosis not present

## 2020-05-26 ENCOUNTER — Other Ambulatory Visit: Payer: Self-pay

## 2020-05-26 ENCOUNTER — Encounter (HOSPITAL_BASED_OUTPATIENT_CLINIC_OR_DEPARTMENT_OTHER): Payer: Self-pay

## 2020-05-26 ENCOUNTER — Emergency Department (HOSPITAL_BASED_OUTPATIENT_CLINIC_OR_DEPARTMENT_OTHER): Payer: BC Managed Care – PPO

## 2020-05-26 ENCOUNTER — Emergency Department (HOSPITAL_BASED_OUTPATIENT_CLINIC_OR_DEPARTMENT_OTHER)
Admission: EM | Admit: 2020-05-26 | Discharge: 2020-05-26 | Disposition: A | Discharge status: Home / Self Care | Payer: BC Managed Care – PPO | Attending: Emergency Medicine | Admitting: Emergency Medicine

## 2020-05-26 DIAGNOSIS — R110 Nausea: Secondary | ICD-10-CM | POA: Insufficient documentation

## 2020-05-26 DIAGNOSIS — R5383 Other fatigue: Secondary | ICD-10-CM | POA: Diagnosis not present

## 2020-05-26 DIAGNOSIS — C50919 Malignant neoplasm of unspecified site of unspecified female breast: Secondary | ICD-10-CM | POA: Insufficient documentation

## 2020-05-26 DIAGNOSIS — R0789 Other chest pain: Secondary | ICD-10-CM | POA: Insufficient documentation

## 2020-05-26 DIAGNOSIS — K2100 Gastro-esophageal reflux disease with esophagitis, without bleeding: Secondary | ICD-10-CM

## 2020-05-26 DIAGNOSIS — R079 Chest pain, unspecified: Secondary | ICD-10-CM

## 2020-05-26 DIAGNOSIS — K219 Gastro-esophageal reflux disease without esophagitis: Secondary | ICD-10-CM | POA: Diagnosis not present

## 2020-05-26 LAB — CBC
HCT: 37.5 % (ref 36.0–46.0)
Hemoglobin: 12.9 g/dL (ref 12.0–15.0)
MCH: 31.5 pg (ref 26.0–34.0)
MCHC: 34.4 g/dL (ref 30.0–36.0)
MCV: 91.7 fL (ref 80.0–100.0)
Platelets: 454 10*3/uL — ABNORMAL HIGH (ref 150–400)
RBC: 4.09 MIL/uL (ref 3.87–5.11)
RDW: 11.6 % (ref 11.5–15.5)
WBC: 10.2 10*3/uL (ref 4.0–10.5)
nRBC: 0 % (ref 0.0–0.2)

## 2020-05-26 LAB — BASIC METABOLIC PANEL
Anion gap: 10 (ref 5–15)
BUN: 15 mg/dL (ref 6–20)
CO2: 23 mmol/L (ref 22–32)
Calcium: 9.4 mg/dL (ref 8.9–10.3)
Chloride: 107 mmol/L (ref 98–111)
Creatinine, Ser: 0.61 mg/dL (ref 0.44–1.00)
GFR calc Af Amer: >60 mL/min (ref >60)
GFR calc non Af Amer: >60 mL/min (ref >60)
Glucose, Bld: 162 mg/dL — ABNORMAL HIGH (ref 70–99)
Potassium: 3.4 mmol/L — ABNORMAL LOW (ref 3.5–5.1)
Sodium: 140 mmol/L (ref 135–145)

## 2020-05-26 LAB — TROPONIN I (HIGH SENSITIVITY): Troponin I (High Sensitivity): <2 ng/L (ref <18)

## 2020-05-26 LAB — PREGNANCY, URINE: Preg Test, Ur: NEGATIVE

## 2020-05-26 MED ORDER — PANTOPRAZOLE SODIUM 20 MG PO TBEC: 40.0000 mg | DELAYED_RELEASE_TABLET | Freq: Every day | ORAL | 0 refills | Status: DC

## 2020-05-26 MED ORDER — LIDOCAINE VISCOUS HCL 2 % MT SOLN
15.0000 mL | Freq: Once | OROMUCOSAL | Status: AC
  Administered 2020-05-26: 15 mL via ORAL
  Filled 2020-05-26: qty 15

## 2020-05-26 MED ORDER — ALUM & MAG HYDROXIDE-SIMETH 200-200-20 MG/5ML PO SUSP
30.0000 mL | Freq: Once | ORAL | Status: AC
  Administered 2020-05-26: 30 mL via ORAL
  Filled 2020-05-26: qty 30

## 2020-05-26 MED ORDER — FLUCONAZOLE 200 MG PO TABS: 200.0000 mg | ORAL_TABLET | Freq: Every day | ORAL | 0 refills | Status: DC

## 2020-05-26 NOTE — ED Provider Notes (Signed)
[facility] Provider Note   CSN: [id number] Arrival date & time: 05/26/20  1302     History No chief complaint on file.   Tammy Gilmore is a 49 y.o. female.  HPI      3 weeks ago head cold Thought getting over it, had old cipro, took 3 days worth and prednisone for 3 days and thought getting better, then Wednesday woke up with chest pain, went to brush teeth and had thrush on tongue, went to urgent care, heard wheezing, CXR and said looked ok but had thrush, swabbed tongue and throat and did covid test-went home with albuterol, doxycycline, nystatin and prednisone, started that, covid test was negative, throat culture negative.  No coughing Just feeling some pressure in chest, not feeling right This AM woke up and felt tightness in chest, nausea, loose stools, lightheaded Took medicine, and tongue felt like cotton, thrush looked like it was back Pull over car and called 911 because felt lightheaded, they came and was checked out and husband brought her here  Feels like needs to clear throat  Constant chest pressure since Wednesday, drank a gingerale yesterday and belched and it felt better, felt like food sticking but able to swallow, not regurgitating Sunday-Monday night had heart burn but hasn't had it in a long time.  Had someone staying with them not sure infection type. Not worse with exertion, deep breath or positions  No vomiting, shortness of breath, diaphoresis  No long trips/leg pain or swelling/no recent surgeris No fam hx of early CAD No cigarettes, occ etoh Father had pacemaker  BRCA2 gene   Past Medical History:  Diagnosis Date  . Breast cancer ([place])    has gene, but never had CA. Preventative mastectomy    Patient Active Problem List   Diagnosis Date Noted  . Seasonal allergic rhinitis due to pollen 03/02/2019  . Stress and adjustment reaction 03/02/2019  . Status post bilateral salpingo-oophorectomy 12/17/2018  .  Surgical menopause 02/14/2014  . Genetic susceptibility to breast cancer 02/09/2014  . BRCA2 positive 02/27/2013  . Status post bilateral mastectomy 02/27/2013    Past Surgical History:  Procedure Laterality Date  . ABDOMINAL HYSTERECTOMY     ovaries and fallopian tubes  . double masectomy  2011  . MASTECTOMY       OB History   No obstetric history on file.     Family History  Problem Relation Age of Onset  . Cancer Father        lung  . Cancer Brother        gall bladder  . Breast cancer Paternal Grandmother     Social History   Tobacco Use  . Smoking status: Never Smoker  . Smokeless tobacco: Never Used  Vaping Use  . Vaping Use: Never used  Substance Use Topics  . Alcohol use: Yes    Comment: occ  . Drug use: Never    Home Medications Prior to Admission medications   Medication Sig Start Date End Date Taking? Authorizing Provider  cyclobenzaprine (FLEXERIL) 10 MG tablet TK 0.5-1 TS PO NIGHTLY. APPROXIMATELY ONE PRIOR TO BEDTIME 10/03/19   [provider]  fluconazole (DIFLUCAN) 200 MG tablet Take 1 tablet (200 mg total) by mouth daily for 14 days. 05/26/20 06/09/20  Gareth Morgan, MD  hydrOXYzine (ATARAX/VISTARIL) 10 MG tablet TAKE 1 TABLET(10 MG) BY MOUTH THREE TIMES DAILY AS NEEDED 11/29/19   Cirigliano, Garvin Fila, DO  Multiple Vitamin (MULTI-VITAMIN) tablet Take by mouth.  [provider]  pantoprazole (PROTONIX) 20 MG tablet Take 2 tablets (40 mg total) by mouth daily for 14 days. 05/26/20 06/09/20  Gareth Morgan, MD    Allergies    Patient has no known allergies.  Review of Systems   Review of Systems  Constitutional: Positive for fatigue. Negative for fever.  HENT: Negative for sore throat.   Eyes: Negative for visual disturbance.  Respiratory: Negative for cough and shortness of breath.   Cardiovascular: Positive for chest pain.  Gastrointestinal: Positive for nausea. Negative for abdominal pain and vomiting.  Genitourinary:  Negative for difficulty urinating.  Musculoskeletal: Negative for back pain and neck pain.  Skin: Negative for rash.  Neurological: Negative for syncope and headaches.    Physical Exam Updated Vital Signs BP 121/69 (BP Location: Right Arm)   Pulse 82   Temp 97.9 F (36.6 C) (Oral)   Resp 18   Ht 5' (1.524 m)   Wt 50.8 kg   SpO2 100%   BMI 21.87 kg/m   Physical Exam Vitals and nursing note reviewed.  Constitutional:      General: She is not in acute distress.    Appearance: She is well-developed. She is not diaphoretic.  HENT:     Head: Normocephalic and atraumatic.  Eyes:     Conjunctiva/sclera: Conjunctivae normal.  Cardiovascular:     Rate and Rhythm: Normal rate and regular rhythm.     Heart sounds: Normal heart sounds. No murmur heard.  No friction rub. No gallop.   Pulmonary:     Effort: Pulmonary effort is normal. No respiratory distress.     Breath sounds: Normal breath sounds. No wheezing or rales.  Abdominal:     General: There is no distension.     Palpations: Abdomen is soft.     Tenderness: There is no abdominal tenderness. There is no guarding.  Musculoskeletal:        General: No tenderness.     Cervical back: Normal range of motion.  Skin:    General: Skin is warm and dry.     Findings: No erythema or rash.  Neurological:     Mental Status: She is alert and oriented to person, place, and time.     ED Results / Procedures / Treatments   Labs (all labs ordered are listed, but only abnormal results are displayed) Labs Reviewed  BASIC METABOLIC PANEL - Abnormal; Notable for the following components:      Result Value   Potassium 3.4 (*)    Glucose, Bld 162 (*)    All other components within normal limits  CBC - Abnormal; Notable for the following components:   Platelets 454 (*)    All other components within normal limits  PREGNANCY, URINE  TROPONIN I (HIGH SENSITIVITY)    EKG EKG Interpretation  Date/Time:  Sunday May 26 2020 13:25:15  EDT Ventricular Rate:  96 PR Interval:    QRS Duration: 86 QT Interval:  353 QTC Calculation: 447 R Axis:   70 Text Interpretation: Sinus rhythm Biatrial enlargement No significant change since last tracing Confirmed by Gareth Morgan [phone]) on 05/26/2020 2:21:23 PM   Radiology DG Chest 2 View  Result Date: 05/26/2020 CLINICAL DATA:  Chest pain, congestion EXAM: CHEST - 2 VIEW COMPARISON:  04/14/2019 FINDINGS: The heart size and mediastinal contours are within normal limits. Both lungs are clear. The visualized skeletal structures are unremarkable. IMPRESSION: No active cardiopulmonary disease. Electronically Signed   By: Jerilynn Mages.  Shick M.D.   On:  05/26/2020 14:06    Procedures Procedures (including critical care time)  Medications Ordered in ED Medications  alum & mag hydroxide-simeth (MAALOX/MYLANTA) 200-200-20 MG/5ML suspension 30 mL (30 mLs Oral Given 05/26/20 1448)    And  lidocaine (XYLOCAINE) 2 % viscous mouth solution 15 mL (15 mLs Oral Given 05/26/20 1448)    ED Course  I have reviewed the triage vital signs and the nursing notes.  Pertinent labs & imaging results that were available during my care of the patient were reviewed by me and considered in my medical decision making (see chart for details).    MDM Rules/Calculators/A&P                          49yo female presents with concern for chest pain.  Differential diagnosis for chest pain includes pulmonary embolus, dissection, pneumothorax, pneumonia, ACS, myocarditis, pericarditis, esophageal pathology.  EKG was done and evaluate by me and showed no acute ST changes and no signs of pericarditis. Chest x-ray was done and evaluated by me and radiology and showed no sign of pneumonia or pneumothorax. Patient is low risk HEART score and had negative troponin after constant symptoms and doubt ACS.  Do not feel history or exam are consistent with aortic dissection.   Symptoms changed by swallowing, belching, FB sensation but able  to tolerate secretions and po and no signs of food impaction.  Reports having thrush and diagnosis of this at urgent care. No sign of thrush on my exam, however given typical description and diagnosis at urgent care do feel candidal esophagitis related to recent steroid use on the differential and reasonable to treat with fluconazole.  Reflux may also be worsened by steroid use. Given rx for fluconazole and pantoprazole. Recommend PCP/GI follow up.  Patient discharged in stable condition with understanding of reasons to return.   Final Clinical Impression(s) / ED Diagnoses Final diagnoses:  Chest pain, unspecified type  Gastroesophageal reflux disease with esophagitis without hemorrhage    Rx / DC Orders ED Discharge Orders         Ordered    fluconazole (DIFLUCAN) 200 MG tablet  Daily     Discontinue  Reprint     05/26/20 1448    pantoprazole (PROTONIX) 20 MG tablet  Daily     Discontinue  Reprint     05/26/20 1448           Gareth Morgan, MD 05/26/20 2230

## 2020-05-26 NOTE — ED Notes (Signed)
AVS reviewed with client, second Troponin cancelled per EDP, reviewed Rx with pt as well and high recommended that pt seek GI Consult per EDP recommendations. Opportunity for questions provided

## 2020-05-26 NOTE — ED Notes (Signed)
Presents with mid sternal pressure/ chest discomfort, on and off since Thursday, non-radiating, no diaphoresis, had some nausea this am.

## 2020-05-26 NOTE — ED Triage Notes (Signed)
Pt presents with persistent and worsening chest tightness. Pt has been having URI symptoms for several weeks. Earlier this week pt was seen at [facility] and had a normal chest XR and was diagnosed with thrush and a "chest cold." Pt was prescribed doxyxylcine,  Prednisone, albuterol, nystatin. Pt's chest tightness persists and is worse today. Denies ShOB or cough.

## 2020-05-29 ENCOUNTER — Encounter: Payer: Self-pay | Admitting: Family Medicine

## 2020-05-31 ENCOUNTER — Other Ambulatory Visit: Payer: Self-pay

## 2020-05-31 ENCOUNTER — Telehealth (INDEPENDENT_AMBULATORY_CARE_PROVIDER_SITE_OTHER): Payer: BC Managed Care – PPO | Admitting: Nurse Practitioner

## 2020-05-31 ENCOUNTER — Encounter: Payer: Self-pay | Admitting: Nurse Practitioner

## 2020-05-31 VITALS — Ht 60.0 in | Wt 112.0 lb

## 2020-05-31 DIAGNOSIS — B3781 Candidal esophagitis: Secondary | ICD-10-CM | POA: Diagnosis not present

## 2020-05-31 DIAGNOSIS — K2100 Gastro-esophageal reflux disease with esophagitis, without bleeding: Secondary | ICD-10-CM | POA: Diagnosis not present

## 2020-05-31 DIAGNOSIS — H6983 Other specified disorders of Eustachian tube, bilateral: Secondary | ICD-10-CM

## 2020-05-31 DIAGNOSIS — B370 Candidal stomatitis: Secondary | ICD-10-CM

## 2020-05-31 MED ORDER — OMEPRAZOLE 20 MG PO CPDR: 20.0000 mg | DELAYED_RELEASE_CAPSULE | Freq: Two times a day (BID) | ORAL | 3 refills | Status: DC

## 2020-05-31 MED ORDER — NYSTATIN 100000 UNIT/ML MT SUSP: 5.0000 mL | Freq: Three times a day (TID) | OROMUCOSAL | 0 refills | Status: DC

## 2020-05-31 MED ORDER — FLUTICASONE PROPIONATE 50 MCG/ACT NA SUSP: 2.0000 | Freq: Every day | NASAL | 0 refills | Status: DC

## 2020-05-31 NOTE — Patient Instructions (Signed)
Stop oral diflucan Resume nystatin oral solution Increase omeprazole to 20mg  BIDx14days Start flonase daily x1week

## 2020-05-31 NOTE — Progress Notes (Signed)
Virtual Visit via Video Note  I connected with@ on 05/31/20 at 12:30 PM EDT by a video enabled telemedicine application and verified that I am speaking with the correct person using two identifiers.  Location: Patient:Home Provider: Office Participants: patient and provider  I discussed the limitations of evaluation and management by telemedicine and the availability of in person appointments. I also discussed with the patient that there may be a patient responsible charge related to this service. The patient expressed understanding and agreed to proceed.  CC:Pt reports pain in left side of ear and left side of throat and headaches//pt said she has went Urgent care SUnday they did chest xray EKG and blood work all was good and neg an did strep test and covid all was neg  History of Present Illness: Ms. Kraska has completed a course of oral abx, oral prednisone and nystatin in last 2weeks. She was treated for acute sinusitis, acute bronchitis, and thrush. Her symptoms have improved mostly, but has lingering nausea, heartburn, and sorethroat. She denies any dysphagia or dysphonia or swollen lymph nodes or neck pain or fever or ABD pain or melena or diarrhea. Diflucan 200mg  and omeprazole was started 3days ago with minimal improvement.   Observations/Objective: Physical Exam HENT:     Right Ear: External ear normal.     Left Ear: External ear normal.     Mouth/Throat:     Mouth: Mucous membranes are moist.     Pharynx: Uvula midline. No posterior oropharyngeal erythema.     Tonsils: No tonsillar exudate.   Eyes:     General: No scleral icterus.    Conjunctiva/sclera: Conjunctivae normal.  Neurological:     Mental Status: She is alert and oriented to person, place, and time.    Assessment and Plan: Cullen was seen today for ear pain.  Diagnoses and all orders for this visit:  Thrush of mouth and esophagus ([place]) -     nystatin (MYCOSTATIN) 100000 UNIT/ML suspension; Use as  directed 5 mLs (500,000 Units total) in the mouth or throat 3 (three) times daily.  Eustachian tube dysfunction, bilateral -     fluticasone (FLONASE) 50 MCG/ACT nasal spray; Place 2 sprays into both nostrils daily.  Gastroesophageal reflux disease with esophagitis without hemorrhage -     omeprazole (PRILOSEC) 20 MG capsule; Take 1 capsule (20 mg total) by mouth 2 (two) times daily before a meal.   Follow Up Instructions: Stop oral diflucan Resume nystatin oral solution. Increase omeprazole to 20mg  BIDx14days Start flonase daily x1week.   I discussed the assessment and treatment plan with the patient. The patient was provided an opportunity to ask questions and all were answered. The patient agreed with the plan and demonstrated an understanding of the instructions.   The patient was advised to call back or seek an in-person evaluation if the symptoms worsen or if the condition fails to improve as anticipated.   Wilfred Lacy, NP

## 2020-09-17 DIAGNOSIS — L918 Other hypertrophic disorders of the skin: Secondary | ICD-10-CM | POA: Diagnosis not present

## 2020-09-17 DIAGNOSIS — Z85828 Personal history of other malignant neoplasm of skin: Secondary | ICD-10-CM | POA: Diagnosis not present

## 2020-09-17 DIAGNOSIS — L821 Other seborrheic keratosis: Secondary | ICD-10-CM | POA: Diagnosis not present

## 2020-09-17 DIAGNOSIS — D225 Melanocytic nevi of trunk: Secondary | ICD-10-CM | POA: Diagnosis not present

## 2020-10-15 ENCOUNTER — Other Ambulatory Visit: Payer: Self-pay

## 2020-10-16 ENCOUNTER — Ambulatory Visit: Payer: BC Managed Care – PPO | Admitting: Family

## 2020-10-16 ENCOUNTER — Encounter: Payer: Self-pay | Admitting: Family

## 2020-10-16 VITALS — BP 116/78 | HR 78 | Temp 97.1°F | Wt 117.0 lb

## 2020-10-16 DIAGNOSIS — H00015 Hordeolum externum left lower eyelid: Secondary | ICD-10-CM

## 2020-10-16 MED ORDER — CEPHALEXIN 500 MG PO CAPS: 500.0000 mg | ORAL_CAPSULE | Freq: Two times a day (BID) | ORAL | 0 refills | Status: DC

## 2020-10-16 NOTE — Progress Notes (Signed)
Acute Office Visit  Subjective:    Patient ID: Tammy Gilmore, female    DOB: [birth year], 49 y.o.   MRN: [id number]  Chief Complaint  Patient presents with  . Conjunctivitis    left eye red, little swollen x 6 days, left ear pain x 3 days. Face little puffy     HPI Patient is in today with c/o left eyelid redness and swelling x 6 days, left ear tender to touch, and slight swelling to the left side of the face under the eye. She denies any pain. Has not been taking any medications. 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 Breast cancer ([place])    has gene, but never had CA. Preventative mastectomy    Past Surgical History:  Procedure Laterality Date  . ABDOMINAL HYSTERECTOMY     ovaries and fallopian tubes  . double masectomy  2011  . MASTECTOMY      Family History  Problem Relation Age of Onset  . Cancer Father        lung  . Cancer Brother        gall bladder  . Breast cancer Paternal Grandmother     Social History   Socioeconomic History  . Marital status: Married    Spouse name: Not on file  . Number of children: Not on file  . Years of education: Not on file  . Highest education level: Not on file  Occupational History  . Not on file  Tobacco Use  . Smoking status: Never Smoker  . Smokeless tobacco: Never Used  Vaping Use  . Vaping Use: Never used  Substance and Sexual Activity  . Alcohol use: Yes    Comment: occ  . Drug use: Never  . Sexual activity: Not on file  Other Topics Concern  . Not on file  Social History Narrative  . Not on file   Social Determinants of Health   Financial Resource Strain:   . Difficulty of Paying Living Expenses: Not on file  Food Insecurity:   . Worried About Charity fundraiser in the Last Year: Not on file  . Ran Out of Food in the Last Year: Not on file  Transportation Needs:   . Lack of Transportation (Medical): Not on file  . Lack of Transportation (Non-Medical): Not on file    Physical Activity:   . Days of Exercise per Week: Not on file  . Minutes of Exercise per Session: Not on file  Stress:   . Feeling of Stress : Not on file  Social Connections:   . Frequency of Communication with Friends and Family: Not on file  . Frequency of Social Gatherings with Friends and Family: Not on file  . Attends Religious Services: Not on file  . Active Member of Clubs or Organizations: Not on file  . Attends Archivist Meetings: Not on file  . Marital Status: Not on file  Intimate Partner Violence:   . Fear of Current or Ex-Partner: Not on file  . Emotionally Abused: Not on file  . Physically Abused: Not on file  . Sexually Abused: Not on file    Outpatient Medications Prior to Visit  Medication Sig Dispense Refill  . fluticasone (FLONASE) 50 MCG/ACT nasal spray Place 2 sprays into both nostrils daily. 16 g 0  . Multiple Vitamin (MULTI-VITAMIN) tablet Take by mouth.    Marland Kitchen omeprazole (PRILOSEC) 20 MG capsule Take 1 capsule (20 mg  total) by mouth 2 (two) times daily before a meal. 28 capsule 3  . hydrOXYzine (ATARAX/VISTARIL) 10 MG tablet TAKE 1 TABLET(10 MG) BY MOUTH THREE TIMES DAILY AS NEEDED (Patient not taking: Reported on 10/16/2020) 30 tablet 0  . nystatin (MYCOSTATIN) 100000 UNIT/ML suspension Use as directed 5 mLs (500,000 Units total) in the mouth or throat 3 (three) times daily. 120 mL 0   No facility-administered medications prior to visit.    No Known Allergies  Review of Systems  Constitutional: Negative.   HENT: Negative.        Left ear tenderness  Eyes: Negative for itching and visual disturbance.       Swelling and redness to the left lower lid  Respiratory: Negative.   Cardiovascular: Negative.   Endocrine: Negative.   Musculoskeletal: Negative.   Skin: Negative.  Negative for rash.  Allergic/Immunologic: Negative.   Neurological: Negative.   Psychiatric/Behavioral: Negative.   All other systems reviewed and are negative.       Objective:    Physical Exam Constitutional:      Appearance: Normal appearance. She is normal weight.  HENT:     Right Ear: Ear canal normal.     Left Ear: Tympanic membrane and ear canal normal.     Mouth/Throat:     Mouth: Mucous membranes are moist.     Pharynx: Oropharynx is clear.  Eyes:     General:        Right eye: No foreign body, discharge or hordeolum.        Left eye: Hordeolum present.No foreign body or discharge.     Extraocular Movements: Extraocular movements intact.     Conjunctiva/sclera: Conjunctivae normal.     Pupils: Pupils are equal, round, and reactive to light.   Cardiovascular:     Rate and Rhythm: Normal rate and regular rhythm.  Pulmonary:     Effort: Pulmonary effort is normal.     Breath sounds: Normal breath sounds.  Musculoskeletal:     Cervical back: Normal range of motion and neck supple.  Neurological:     General: No focal deficit present.     Mental Status: She is alert and oriented to person, place, and time.  Psychiatric:        Mood and Affect: Mood normal.     BP 116/78   Pulse 78   Temp (!) 97.1 F (36.2 C) (Tympanic)   Wt 117 lb (53.1 kg)   SpO2 97%   BMI 22.85 kg/m  Wt Readings from Last 3 Encounters:  10/16/20 117 lb (53.1 kg)  05/31/20 112 lb (50.8 kg)  05/26/20 112 lb (50.8 kg)    Health Maintenance Due  Topic Date Due  . Hepatitis C Screening  Never done  . HIV Screening  Never done  . PAP SMEAR-Modifier  Never done  . COLONOSCOPY  Never done  . INFLUENZA VACCINE  06/23/2020    There are no preventive care reminders to display for this patient.   Lab Results  Component Value Date   TSH 1.79 02/08/2019   Lab Results  Component Value Date   WBC 10.2 05/26/2020   HGB 12.9 05/26/2020   HCT 37.5 05/26/2020   MCV 91.7 05/26/2020   PLT 454 (H) 05/26/2020   Lab Results  Component Value Date   NA 140 05/26/2020   K 3.4 (L) 05/26/2020   CO2 23 05/26/2020   GLUCOSE 162 (H) 05/26/2020   BUN 15  05/26/2020   CREATININE 0.61 05/26/2020  BILITOT 0.5 04/14/2019   ALKPHOS 59 04/14/2019   AST 14 03/22/2020   ALT 23 03/22/2020   PROT 7.8 04/14/2019   ALBUMIN 5.1 (H) 04/14/2019   CALCIUM 9.4 05/26/2020   ANIONGAP 10 05/26/2020   GFR 103.48 02/09/2020   Lab Results  Component Value Date   CHOL 232 (H) 02/09/2020   Lab Results  Component Value Date   HDL 72.00 02/09/2020   Lab Results  Component Value Date   LDLCALC 149 (H) 02/09/2020   Lab Results  Component Value Date   TRIG 53.0 02/09/2020   Lab Results  Component Value Date   CHOLHDL 3 02/09/2020   No results found for: HGBA1C     Assessment & Plan:   Problem List Items Addressed This Visit    None    Visit Diagnoses    Hordeolum externum of left lower eyelid    -  Primary       Meds ordered this encounter  Medications  . cephALEXin (KEFLEX) 500 MG capsule    Sig: Take 1 capsule (500 mg total) by mouth 2 (two) times daily.    Dispense:  21 capsule    Refill:  0   Call the office with any questions of concerns.   Kennyth Arnold, FNP

## 2020-10-16 NOTE — Patient Instructions (Signed)

## 2020-12-17 ENCOUNTER — Other Ambulatory Visit: Payer: BC Managed Care – PPO

## 2020-12-17 ENCOUNTER — Other Ambulatory Visit: Payer: Self-pay

## 2020-12-17 ENCOUNTER — Encounter: Payer: Self-pay | Admitting: Nurse Practitioner

## 2020-12-17 ENCOUNTER — Ambulatory Visit: Payer: BC Managed Care – PPO | Admitting: Nurse Practitioner

## 2020-12-17 VITALS — BP 112/70 | HR 78 | Temp 97.3°F | Ht 60.0 in | Wt 116.0 lb

## 2020-12-17 DIAGNOSIS — N3001 Acute cystitis with hematuria: Secondary | ICD-10-CM | POA: Diagnosis not present

## 2020-12-17 DIAGNOSIS — R8290 Unspecified abnormal findings in urine: Secondary | ICD-10-CM

## 2020-12-17 LAB — POCT URINALYSIS DIPSTICK
Bilirubin, UA: NEGATIVE
Blood, UA: POSITIVE
Glucose, UA: NEGATIVE
Ketones, UA: NEGATIVE
Leukocytes, UA: NEGATIVE
Nitrite, UA: NEGATIVE
Protein, UA: NEGATIVE
Spec Grav, UA: 1.015 (ref 1.010–1.025)
Urobilinogen, UA: 0.2 E.U./dL
pH, UA: 6 (ref 5.0–8.0)

## 2020-12-17 NOTE — Progress Notes (Signed)
   Subjective:  Patient ID: Tammy Gilmore, female    DOB: 12-22-1970  Age: 50 y.o. MRN: [id number]  CC: Acute Visit (Pt c/o burning when urinating, urinary frequency x 1 day. Pt took AZO and it gave some relief but she still has discomfort. )  Urinary Tract Infection  This is a new problem. The current episode started yesterday. The problem occurs every urination. The problem has been unchanged. The quality of the pain is described as burning. The pain is severe. There has been no fever. She is sexually active. There is no history of pyelonephritis. Associated symptoms include chills, frequency and urgency. Pertinent negatives include no discharge, flank pain, hematuria, hesitancy, nausea, possible pregnancy, sweats or vomiting. She has tried home medications and increased fluids for the symptoms. The treatment provided mild relief. There is no history of catheterization, kidney stones, recurrent UTIs, a single kidney or urinary stasis.   Reviewed past Medical, Social and Family history today.  Outpatient Medications Prior to Visit  Medication Sig Dispense Refill  . fluticasone (FLONASE) 50 MCG/ACT nasal spray Place 2 sprays into both nostrils daily. 16 g 0  . Meth-Hyo-M Bl-Na Phos-Ph Sal (URIBEL) 118 MG CAPS Take by mouth.    . Multiple Vitamin (MULTI-VITAMIN) tablet Take by mouth.    . nitrofurantoin (MACRODANTIN) 100 MG capsule Take 100 mg by mouth 4 (four) times daily.    Marland Kitchen omeprazole (PRILOSEC) 20 MG capsule Take 1 capsule (20 mg total) by mouth 2 (two) times daily before a meal. 28 capsule 3  . Phenazopyridine HCl (AZO TABS PO) Take by mouth.    . cephALEXin (KEFLEX) 500 MG capsule Take 1 capsule (500 mg total) by mouth 2 (two) times daily. (Patient not taking: Reported on 12/17/2020) 21 capsule 0   No facility-administered medications prior to visit.    ROS See HPI  Objective:  BP 112/70 (BP Location: Left Arm, Patient Position: Sitting, Cuff Size: Normal)   Pulse 78   Temp (!)  97.3 F (36.3 C) (Temporal)   Ht 5' (1.524 m)   Wt 116 lb (52.6 kg)   SpO2 98%   BMI 22.65 kg/m   Physical Exam Pulmonary:     Effort: Pulmonary effort is normal.  Abdominal:     Palpations: Abdomen is soft.     Tenderness: There is abdominal tenderness in the suprapubic area. There is no right CVA tenderness, left CVA tenderness or guarding.  Neurological:     Mental Status: She is alert.    Assessment & Plan:  This visit occurred during the SARS-CoV-2 public health emergency.  Safety protocols were in place, including screening questions prior to the visit, additional usage of staff PPE, and extensive cleaning of exam room while observing appropriate contact time as indicated for disinfecting solutions.   Chosen was seen today for acute visit.  Diagnoses and all orders for this visit:  UTI symptoms -     POCT Urinalysis Dipstick  continue current macrobid rx 1cap BID with food while waiting for urine culture results.  Problem List Items Addressed This Visit   None   Visit Diagnoses    UTI symptoms    -  Primary   Relevant Orders   POCT Urinalysis Dipstick      Follow-up: No follow-ups on file.  Wilfred Lacy, NP

## 2020-12-17 NOTE — Patient Instructions (Signed)
Maintain adequate oral hydration Continue Macrobid 1cap BID with food You will be contacted with urine culture results.

## 2020-12-19 LAB — URINE CULTURE
MICRO NUMBER:: 11454331
Result:: NO GROWTH
SPECIMEN QUALITY:: ADEQUATE

## 2020-12-26 DIAGNOSIS — Z1501 Genetic susceptibility to malignant neoplasm of breast: Secondary | ICD-10-CM | POA: Diagnosis not present

## 2020-12-26 DIAGNOSIS — N952 Postmenopausal atrophic vaginitis: Secondary | ICD-10-CM | POA: Diagnosis not present

## 2020-12-26 DIAGNOSIS — Z1509 Genetic susceptibility to other malignant neoplasm: Secondary | ICD-10-CM | POA: Diagnosis not present

## 2020-12-26 DIAGNOSIS — Z803 Family history of malignant neoplasm of breast: Secondary | ICD-10-CM | POA: Diagnosis not present

## 2021-01-24 ENCOUNTER — Encounter: Payer: Self-pay | Admitting: Nurse Practitioner

## 2021-05-30 DIAGNOSIS — J019 Acute sinusitis, unspecified: Secondary | ICD-10-CM | POA: Diagnosis not present

## 2021-05-30 DIAGNOSIS — J9801 Acute bronchospasm: Secondary | ICD-10-CM | POA: Diagnosis not present

## 2021-06-03 ENCOUNTER — Telehealth: Payer: BC Managed Care – PPO | Admitting: Internal Medicine

## 2021-06-03 DIAGNOSIS — T50905A Adverse effect of unspecified drugs, medicaments and biological substances, initial encounter: Secondary | ICD-10-CM | POA: Diagnosis not present

## 2021-06-03 DIAGNOSIS — R1310 Dysphagia, unspecified: Secondary | ICD-10-CM | POA: Diagnosis not present

## 2021-06-03 DIAGNOSIS — R197 Diarrhea, unspecified: Secondary | ICD-10-CM | POA: Diagnosis not present

## 2021-06-11 ENCOUNTER — Other Ambulatory Visit: Payer: Self-pay

## 2021-06-12 ENCOUNTER — Encounter: Payer: Self-pay | Admitting: Family Medicine

## 2021-06-12 ENCOUNTER — Ambulatory Visit (INDEPENDENT_AMBULATORY_CARE_PROVIDER_SITE_OTHER): Payer: BC Managed Care – PPO | Admitting: Family Medicine

## 2021-06-12 VITALS — BP 100/48 | HR 92 | Temp 98.1°F | Ht 59.0 in | Wt 114.8 lb

## 2021-06-12 DIAGNOSIS — H9192 Unspecified hearing loss, left ear: Secondary | ICD-10-CM

## 2021-06-12 DIAGNOSIS — Z13220 Encounter for screening for lipoid disorders: Secondary | ICD-10-CM | POA: Diagnosis not present

## 2021-06-12 DIAGNOSIS — F4329 Adjustment disorder with other symptoms: Secondary | ICD-10-CM

## 2021-06-12 DIAGNOSIS — Z0000 Encounter for general adult medical examination without abnormal findings: Secondary | ICD-10-CM | POA: Diagnosis not present

## 2021-06-12 DIAGNOSIS — Z1321 Encounter for screening for nutritional disorder: Secondary | ICD-10-CM

## 2021-06-12 MED ORDER — HYDROXYZINE HCL 10 MG PO TABS: 10.0000 mg | ORAL_TABLET | Freq: Three times a day (TID) | ORAL | 0 refills | Status: DC | PRN

## 2021-06-12 NOTE — Patient Instructions (Signed)
Shingles vaccine

## 2021-06-12 NOTE — Progress Notes (Signed)
Tammy Gilmore is a 50 y.o. female  Chief Complaint  Patient presents with   Annual Exam    CPE. No breast or pelvic exam needed    HPI: Tammy Gilmore is a 50 y.o. female patient seen today for annual CPE, labs. She is not fasting. Her husband had fatal MI in 01/2021  Last PAP: s/p B/L salpingo-oopherectomy; last PAP 12/2019 Last mammo: s/p B/L prophylactic mastectomy (no h/o cancer) Last colonoscopy: Cologuard negative 03/2020 - due in 03/2023  Dental: UTD Vision: due, will make appt  Med refills needed today: yes per order   Past Medical History:  Diagnosis Date   Breast cancer ([place])    has gene, but never had CA. Preventative mastectomy    Past Surgical History:  Procedure Laterality Date   ABDOMINAL HYSTERECTOMY     ovaries and fallopian tubes   double masectomy  2011   MASTECTOMY      Social History   Socioeconomic History   Marital status: Married    Spouse name: Not on file   Number of children: Not on file   Years of education: Not on file   Highest education level: Not on file  Occupational History   Not on file  Tobacco Use   Smoking status: Never   Smokeless tobacco: Never  Vaping Use   Vaping Use: Never used  Substance and Sexual Activity   Alcohol use: Yes    Comment: occ   Drug use: Never   Sexual activity: Not on file  Other Topics Concern   Not on file  Social History Narrative   Not on file   Social Determinants of Health   Financial Resource Strain: Not on file  Food Insecurity: Not on file  Transportation Needs: Not on file  Physical Activity: Not on file  Stress: Not on file  Social Connections: Not on file  Intimate Partner Violence: Not on file    Family History  Problem Relation Age of Onset   Cancer Father        lung   Cancer Brother        gall bladder   Breast cancer Paternal Grandmother      Immunization History  Administered Date(s) Administered   Influenza, Quadrivalent, Recombinant, Inj, Pf  10/03/2018   Influenza,inj,Quad PF,6+ Mos 11/14/2015   PFIZER(Purple Top)SARS-COV-2 Vaccination 02/23/2020, 03/27/2020   Tdap 01/25/2019    Outpatient Encounter Medications as of 06/12/2021  Medication Sig   cyclobenzaprine (FLEXERIL) 10 MG tablet Take 10 mg by mouth at bedtime.   fluticasone (FLONASE) 50 MCG/ACT nasal spray Place 2 sprays into both nostrils daily.   hydrOXYzine (ATARAX/VISTARIL) 10 MG tablet Take 1 tablet (10 mg total) by mouth 3 (three) times daily as needed.   Multiple Vitamin (MULTI-VITAMIN) tablet Take by mouth.   zinc gluconate 50 MG tablet Take by mouth.   [DISCONTINUED] nitrofurantoin (MACRODANTIN) 100 MG capsule Take 100 mg by mouth 4 (four) times daily. (Patient not taking: Reported on 06/12/2021)   [DISCONTINUED] omeprazole (PRILOSEC) 20 MG capsule Take 1 capsule (20 mg total) by mouth 2 (two) times daily before a meal.   No facility-administered encounter medications on file as of 06/12/2021.     ROS: Gen: no fever, chills  Skin: no rash, itching ENT: no ear pain, ear drainage, nasal congestion, rhinorrhea, sinus pressure, sore throat Eyes: no blurry vision, double vision Resp: no cough, wheeze,SOB CV: no CP, palpitations, LE edema,  GI: no heartburn, n/v/d/c, abd pain GU: no  dysuria, urgency, frequency, hematuria; no vaginal itching, odor, discharge MSK: no joint pain, myalgias, back pain Neuro: no dizziness, headache, weakness, vertigo Psych: no depression, anxiety, insomnia   No Known Allergies  BP (!) 100/48 (BP Location: Left Arm, Patient Position: Sitting, Cuff Size: Normal)   Pulse 92   Temp 98.1 F (36.7 C) (Temporal)   Ht 4\' 11"  (1.499 m)   Wt 114 lb 12.8 oz (52.1 kg)   SpO2 97%   BMI 23.19 kg/m  Wt Readings from Last 3 Encounters:  06/12/21 114 lb 12.8 oz (52.1 kg)  12/17/20 116 lb (52.6 kg)  10/16/20 117 lb (53.1 kg)   Temp Readings from Last 3 Encounters:  06/12/21 98.1 F (36.7 C) (Temporal)  12/17/20 (!) 97.3 F (36.3 C)  (Temporal)  10/16/20 (!) 97.1 F (36.2 C) (Tympanic)   BP Readings from Last 3 Encounters:  06/12/21 (!) 100/48  12/17/20 112/70  10/16/20 116/78   Pulse Readings from Last 3 Encounters:  06/12/21 92  12/17/20 78  10/16/20 78     Physical Exam Constitutional:      General: She is not in acute distress.    Appearance: She is well-developed.  HENT:     Head: Normocephalic and atraumatic.     Right Ear: Tympanic membrane and ear canal normal.     Left Ear: Tympanic membrane and ear canal normal.     Nose: Nose normal.     Mouth/Throat:     Mouth: Mucous membranes are moist.     Pharynx: Oropharynx is clear.  Eyes:     Conjunctiva/sclera: Conjunctivae normal.  Neck:     Thyroid: No thyromegaly.  Cardiovascular:     Rate and Rhythm: Normal rate and regular rhythm.     Pulses: Normal pulses.  Pulmonary:     Effort: Pulmonary effort is normal. No respiratory distress.     Breath sounds: Normal breath sounds. No wheezing or rhonchi.  Abdominal:     General: Bowel sounds are normal. There is no distension.     Palpations: Abdomen is soft. There is no mass.     Tenderness: There is no abdominal tenderness.  Musculoskeletal:     Cervical back: Neck supple.     Right lower leg: No edema.     Left lower leg: No edema.  Lymphadenopathy:     Cervical: No cervical adenopathy.  Skin:    General: Skin is warm and dry.  Neurological:     Mental Status: She is alert and oriented to person, place, and time.     Motor: No abnormal muscle tone.     Coordination: Coordination normal.  Psychiatric:        Mood and Affect: Mood normal.        Behavior: Behavior normal.     A/P:  1. Annual physical exam - discussed importance of regular CV exercise, healthy diet, adequate sleep - UTD on PAP, mammo, CRC screening - UTD on dental and needs to schedule vision exam - would like to get shingrix vaccine but not at this time - CBC; Future - Basic metabolic panel; Future - AST;  Future - ALT; Future - next CPE in 1 year  2. Screening for lipid disorders - Lipid panel; Future  3. Encounter for vitamin deficiency screening - VITAMIN D 25 Hydroxy (Vit-D Deficiency, Fractures); Future  4. Stress and adjustment reaction Refill: - hydrOXYzine (ATARAX/VISTARIL) 10 MG tablet; Take 1 tablet (10 mg total) by mouth 3 (three) times daily as needed.  Dispense: 60 tablet; Refill: 0  5. Decreased hearing of left ear - Ambulatory referral to Audiology     This visit occurred during the SARS-CoV-2 public health emergency.  Safety protocols were in place, including screening questions prior to the visit, additional usage of staff PPE, and extensive cleaning of exam room while observing appropriate contact time as indicated for disinfecting solutions.

## 2021-06-16 ENCOUNTER — Other Ambulatory Visit: Payer: Self-pay

## 2021-06-16 ENCOUNTER — Other Ambulatory Visit (INDEPENDENT_AMBULATORY_CARE_PROVIDER_SITE_OTHER): Payer: BC Managed Care – PPO

## 2021-06-16 DIAGNOSIS — Z0000 Encounter for general adult medical examination without abnormal findings: Secondary | ICD-10-CM | POA: Diagnosis not present

## 2021-06-16 DIAGNOSIS — Z13220 Encounter for screening for lipoid disorders: Secondary | ICD-10-CM | POA: Diagnosis not present

## 2021-06-16 DIAGNOSIS — Z1321 Encounter for screening for nutritional disorder: Secondary | ICD-10-CM | POA: Diagnosis not present

## 2021-06-16 LAB — BASIC METABOLIC PANEL WITH GFR
BUN: 15 mg/dL (ref 6–23)
CO2: 29 meq/L (ref 19–32)
Calcium: 9.3 mg/dL (ref 8.4–10.5)
Chloride: 104 meq/L (ref 96–112)
Creatinine, Ser: 0.52 mg/dL (ref 0.40–1.20)
GFR: 107.02 mL/min
Glucose, Bld: 99 mg/dL (ref 70–99)
Potassium: 4.2 meq/L (ref 3.5–5.1)
Sodium: 141 meq/L (ref 135–145)

## 2021-06-16 LAB — CBC
HCT: 36.2 % (ref 36.0–46.0)
Hemoglobin: 12.2 g/dL (ref 12.0–15.0)
MCHC: 33.8 g/dL (ref 30.0–36.0)
MCV: 91.6 fl (ref 78.0–100.0)
Platelets: 366 10*3/uL (ref 150.0–400.0)
RBC: 3.95 Mil/uL (ref 3.87–5.11)
RDW: 12.7 % (ref 11.5–15.5)
WBC: 5.6 10*3/uL (ref 4.0–10.5)

## 2021-06-16 LAB — LIPID PANEL
Cholesterol: 257 mg/dL — ABNORMAL HIGH (ref 0–200)
HDL: 81 mg/dL (ref >39.00)
LDL Cholesterol: 159 mg/dL — ABNORMAL HIGH (ref 0–99)
NonHDL: 175.68
Total CHOL/HDL Ratio: 3
Triglycerides: 81 mg/dL (ref 0.0–149.0)
VLDL: 16.2 mg/dL (ref 0.0–40.0)

## 2021-06-16 LAB — AST: AST: 15 U/L (ref 0–37)

## 2021-06-16 LAB — VITAMIN D 25 HYDROXY (VIT D DEFICIENCY, FRACTURES): VITD: 37.12 ng/mL (ref 30.00–100.00)

## 2021-06-16 LAB — ALT: ALT: 18 U/L (ref 0–35)

## 2021-06-16 NOTE — Progress Notes (Signed)
Per orders of Dr. Bryan Lemma pt is here for labs pt tolerated draw well.

## 2021-06-20 ENCOUNTER — Encounter: Payer: Self-pay | Admitting: Family Medicine

## 2021-06-20 DIAGNOSIS — E7800 Pure hypercholesterolemia, unspecified: Secondary | ICD-10-CM | POA: Insufficient documentation

## 2021-07-01 ENCOUNTER — Other Ambulatory Visit: Payer: Self-pay

## 2021-07-01 ENCOUNTER — Ambulatory Visit: Payer: BC Managed Care – PPO | Attending: Family Medicine | Admitting: Audiologist

## 2021-07-01 DIAGNOSIS — H903 Sensorineural hearing loss, bilateral: Secondary | ICD-10-CM | POA: Insufficient documentation

## 2021-07-01 DIAGNOSIS — H9191 Unspecified hearing loss, right ear: Secondary | ICD-10-CM | POA: Insufficient documentation

## 2021-07-01 NOTE — Procedures (Signed)
  Outpatient Audiology and [facility] [place], [place]  [address] [phone]  AUDIOLOGICAL  EVALUATION  NAME: Tammy Gilmore     DOB:   May 25, 1971      MRN: [id number]                                                                                     DATE: 07/01/2021     REFERENT: Ronnald Nian, DO STATUS: Outpatient DIAGNOSIS: Sensorineural Hearing Loss Bilateral, Right Ear Profound Loss  History: [name] was seen for an audiological evaluation.  [name] is receiving a hearing evaluation due to concerns for a feeling of being stopped up in the left ear. [name] has more difficulty hearing than she used to and  is turning up the TV. She also hears a popping sound in the left ear. This difficulty began suddenly.  No pain or tinnitus present in either ear. [name] has a history of right sided deafness. She was diagnosed with deafness in the right ear when she was [age]. She has always used her left ear only and done just fine. The stuffiness and popping in her left ear started recently. She also recently unexpectedly lost her husband. She has been under a lot of stress. She has tension in her jaw for which she recently saw her dentist, they gave her some muscle relaxers. No other relevant case history reported.   Evaluation:  Otoscopy showed a clear view of the tympanic membranes, bilaterally Tympanometry results were consistent with normal middle ear function, bilaterally   Audiometric testing was completed using conventional audiometry with insert and high frequency transducer. Speech Recognition Thresholds were consistent with pure tone average for the left ear. Word Recognition was good at conversation level in the left ear. Pure tone thresholds show normal sloping to a mild sensorineural hearing loss in the left ear and a profound sensorineural hearing loss in the right ear. Test results are consistent with single sided deafness reported to have onset in  childhood.  Results:  The test results were reviewed with [name]. She was advised of the degree of hearing loss in each ear. She was told that typically follow up with an ENT Physician is warranted for single sided deafness and follow up is recommended. She said she is not concerned at this time since the loss has been the same for over forty years. The left ear pressure and popping is likely due to eustachian tube dysfunction from her TMJ. Left ear has a mild hearing loss which I recommend monitoring with annual audiologic evaluations and continue following up with the dentist for jaw tension.   Recommendations: 1.   Annual audiometric testing recommended to monitor left ear hearing loss.    Alfonse Alpers  Audiologist, Au.D., CCC-A 07/01/2021  1:17 PM  Cc: Ronnald Nian, DO

## 2021-09-17 DIAGNOSIS — L570 Actinic keratosis: Secondary | ICD-10-CM | POA: Diagnosis not present

## 2021-09-17 DIAGNOSIS — Z85828 Personal history of other malignant neoplasm of skin: Secondary | ICD-10-CM | POA: Diagnosis not present

## 2021-09-17 DIAGNOSIS — L821 Other seborrheic keratosis: Secondary | ICD-10-CM | POA: Diagnosis not present

## 2021-09-17 DIAGNOSIS — D2261 Melanocytic nevi of right upper limb, including shoulder: Secondary | ICD-10-CM | POA: Diagnosis not present

## 2021-09-17 DIAGNOSIS — D225 Melanocytic nevi of trunk: Secondary | ICD-10-CM | POA: Diagnosis not present

## 2021-10-21 DIAGNOSIS — Z9013 Acquired absence of bilateral breasts and nipples: Secondary | ICD-10-CM | POA: Diagnosis not present

## 2021-11-13 DIAGNOSIS — J019 Acute sinusitis, unspecified: Secondary | ICD-10-CM | POA: Diagnosis not present

## 2021-11-26 DIAGNOSIS — Z9882 Breast implant status: Secondary | ICD-10-CM | POA: Diagnosis not present

## 2021-11-26 DIAGNOSIS — Z9013 Acquired absence of bilateral breasts and nipples: Secondary | ICD-10-CM | POA: Diagnosis not present

## 2022-01-01 DIAGNOSIS — Z1501 Genetic susceptibility to malignant neoplasm of breast: Secondary | ICD-10-CM | POA: Diagnosis not present

## 2022-01-01 DIAGNOSIS — N952 Postmenopausal atrophic vaginitis: Secondary | ICD-10-CM | POA: Diagnosis not present

## 2022-01-01 DIAGNOSIS — Z124 Encounter for screening for malignant neoplasm of cervix: Secondary | ICD-10-CM | POA: Diagnosis not present

## 2022-01-01 DIAGNOSIS — Z1509 Genetic susceptibility to other malignant neoplasm: Secondary | ICD-10-CM | POA: Diagnosis not present

## 2022-05-02 DIAGNOSIS — R509 Fever, unspecified: Secondary | ICD-10-CM | POA: Diagnosis not present

## 2022-05-02 DIAGNOSIS — U071 COVID-19: Secondary | ICD-10-CM | POA: Diagnosis not present

## 2022-05-04 ENCOUNTER — Emergency Department (HOSPITAL_BASED_OUTPATIENT_CLINIC_OR_DEPARTMENT_OTHER)
Admission: EM | Admit: 2022-05-04 | Discharge: 2022-05-04 | Disposition: A | Discharge status: Home / Self Care | Payer: BC Managed Care – PPO | Attending: Emergency Medicine | Admitting: Emergency Medicine

## 2022-05-04 ENCOUNTER — Other Ambulatory Visit: Payer: Self-pay

## 2022-05-04 ENCOUNTER — Emergency Department (HOSPITAL_BASED_OUTPATIENT_CLINIC_OR_DEPARTMENT_OTHER): Payer: BC Managed Care – PPO

## 2022-05-04 ENCOUNTER — Encounter (HOSPITAL_BASED_OUTPATIENT_CLINIC_OR_DEPARTMENT_OTHER): Payer: Self-pay | Admitting: Emergency Medicine

## 2022-05-04 DIAGNOSIS — U071 COVID-19: Secondary | ICD-10-CM | POA: Insufficient documentation

## 2022-05-04 DIAGNOSIS — R0789 Other chest pain: Secondary | ICD-10-CM | POA: Diagnosis not present

## 2022-05-04 MED ORDER — ONDANSETRON 4 MG PO TBDP: ORAL_TABLET | ORAL | 0 refills | Status: DC

## 2022-05-04 MED ORDER — BENZONATATE 100 MG PO CAPS: 100.0000 mg | ORAL_CAPSULE | Freq: Three times a day (TID) | ORAL | 0 refills | Status: DC

## 2022-05-04 NOTE — Discharge Instructions (Signed)
Take tylenol 2 pills 4 times a day and motrin 4 pills 3 times a day.  Drink plenty of fluids.  Return for worsening shortness of breath, headache, confusion. Follow up with your family doctor.   

## 2022-05-04 NOTE — ED Triage Notes (Signed)
Pt POV dx with COVID 19 Saturday.   Started paxlovid Saturday, c/o chest tightness, light headed starting today. Reports good po intake.

## 2022-05-04 NOTE — ED Provider Notes (Signed)
[facility] EMERGENCY DEPARTMENT Provider Note   CSN: [id number] Arrival date & time: 05/04/22  1649     History  No chief complaint on file.   Tammy Gilmore is a 51 y.o. female.  51 yo F with a chief complaints of feel like her chest is tight.  This has been going on for a couple days now.  She unfortunately is been feeling bad for about 3 to 4 days now.  She took a home COVID test that was positive and then went to urgent care and was started on Paxlovid.  Since then she has started having some diarrhea and started feeling like her chest was tight.  She has been able to eat and drink well.        Home Medications Prior to Admission medications   Medication Sig Start Date End Date Taking? Authorizing Provider  benzonatate (TESSALON) 100 MG capsule Take 1 capsule (100 mg total) by mouth every 8 (eight) hours. 05/04/22  Yes Deno Etienne, DO  ondansetron (ZOFRAN-ODT) 4 MG disintegrating tablet '4mg'$  ODT q4 hours prn nausea/vomit 05/04/22  Yes Deno Etienne, DO  cyclobenzaprine (FLEXERIL) 10 MG tablet Take 10 mg by mouth at bedtime. 06/11/21   [provider]  fluticasone (FLONASE) 50 MCG/ACT nasal spray Place 2 sprays into both nostrils daily. 05/31/20   Nche, Charlene Brooke, NP  hydrOXYzine (ATARAX/VISTARIL) 10 MG tablet Take 1 tablet (10 mg total) by mouth 3 (three) times daily as needed. 06/12/21   CiriglianoGarvin Fila, DO  Multiple Vitamin (MULTI-VITAMIN) tablet Take by mouth.    [provider]  zinc gluconate 50 MG tablet Take by mouth.    [provider]      Allergies    Patient has no known allergies.    Review of Systems   Review of Systems  Physical Exam Updated Vital Signs BP (!) 142/78   Pulse 84   Temp 99.3 F (37.4 C) (Oral)   Resp (!) 21   Ht 5' (1.524 m)   Wt 51.3 kg   SpO2 100%   BMI 22.07 kg/m  Physical Exam Vitals and nursing note reviewed.  Constitutional:      General: She is not in acute distress.    Appearance: She  is well-developed. She is not diaphoretic.  HENT:     Head: Normocephalic and atraumatic.  Eyes:     Pupils: Pupils are equal, round, and reactive to light.  Cardiovascular:     Rate and Rhythm: Normal rate and regular rhythm.     Heart sounds: No murmur heard.    No friction rub. No gallop.  Pulmonary:     Effort: Pulmonary effort is normal.     Breath sounds: No wheezing or rales.  Abdominal:     General: There is no distension.     Palpations: Abdomen is soft.     Tenderness: There is no abdominal tenderness.  Musculoskeletal:        General: No tenderness.     Cervical back: Normal range of motion and neck supple.  Skin:    General: Skin is warm and dry.  Neurological:     Mental Status: She is alert and oriented to person, place, and time.  Psychiatric:        Behavior: Behavior normal.     ED Results / Procedures / Treatments   Labs (all labs ordered are listed, but only abnormal results are displayed) Labs Reviewed - No data to display  EKG  EKG Interpretation  Date/Time:  Monday May 04 2022 17:24:24 EDT Ventricular Rate:  85 PR Interval:  134 QRS Duration: 72 QT Interval:  364 QTC Calculation: 433 R Axis:   72 Text Interpretation: Normal sinus rhythm Right atrial enlargement Borderline ECG No significant change since last tracing Confirmed by Deno Etienne [phone]) on 05/04/2022 5:59:57 PM  Radiology DG Chest 2 View  Result Date: 05/04/2022 CLINICAL DATA:  Chest tightness EXAM: CHEST - 2 VIEW COMPARISON:  Chest radiograph 05/26/2020 FINDINGS: The cardiomediastinal silhouette is normal. There is no focal consolidation or pulmonary edema. There is no pleural effusion or pneumothorax There is no acute osseous abnormality. IMPRESSION: No radiographic evidence of acute cardiopulmonary process. Electronically Signed   By: Valetta Mole M.D.   On: 05/04/2022 17:46    Procedures Procedures    Medications Ordered in ED Medications - No data to display  ED Course/  Medical Decision Making/ A&P                           Medical Decision Making Amount and/or Complexity of Data Reviewed Radiology: ordered.  Risk Prescription drug management.   51 yo F with a chief complaint of chest tightness fatigue headache fevers and chills.  This been going on for about 3 to 4 days now.  Patient has tested positive for COVID and is currently taking Paxlovid.  Chest x-ray is independently interpreted by me without focal infection or pneumothorax.  EKG without ischemic findings.  I discussed the results with the patient.  Discussed the typical course of COVID.  I did discuss risk and benefits of laboratory evaluation which she is declining at this time.  She is not hypoxic no tachycardia.  Will treat symptomatically.  PCP follow-up.  6:21 PM:  I have discussed the diagnosis/risks/treatment options with the patient and family.  Evaluation and diagnostic testing in the emergency department does not suggest an emergent condition requiring admission or immediate intervention beyond what has been performed at this time.  They will follow up with  PCP. We also discussed returning to the ED immediately if new or worsening sx occur. We discussed the sx which are most concerning (e.g., sudden worsening pain, fever, inability to tolerate by mouth) that necessitate immediate return. Medications administered to the patient during their visit and any new prescriptions provided to the patient are listed below.  Medications given during this visit Medications - No data to display   The patient appears reasonably screen and/or stabilized for discharge and I doubt any other medical condition or other [facility] requiring further screening, evaluation, or treatment in the ED at this time prior to discharge.          Final Clinical Impression(s) / ED Diagnoses Final diagnoses:  [id number] virus infection    Rx / DC Orders ED Discharge Orders          Ordered    ondansetron (ZOFRAN-ODT)  4 MG disintegrating tablet        05/04/22 1817    benzonatate (TESSALON) 100 MG capsule  Every 8 hours        05/04/22 1817              Deno Etienne, DO 05/04/22 1821

## 2022-09-17 ENCOUNTER — Encounter: Payer: Self-pay | Admitting: Nurse Practitioner

## 2022-09-17 ENCOUNTER — Ambulatory Visit: Payer: BC Managed Care – PPO | Admitting: Nurse Practitioner

## 2022-09-17 VITALS — BP 118/74 | HR 83 | Temp 97.5°F | Wt 117.8 lb

## 2022-09-17 DIAGNOSIS — E559 Vitamin D deficiency, unspecified: Secondary | ICD-10-CM

## 2022-09-17 DIAGNOSIS — Z1501 Genetic susceptibility to malignant neoplasm of breast: Secondary | ICD-10-CM | POA: Diagnosis not present

## 2022-09-17 DIAGNOSIS — R7301 Impaired fasting glucose: Secondary | ICD-10-CM

## 2022-09-17 DIAGNOSIS — E7800 Pure hypercholesterolemia, unspecified: Secondary | ICD-10-CM | POA: Diagnosis not present

## 2022-09-17 DIAGNOSIS — Z23 Encounter for immunization: Secondary | ICD-10-CM

## 2022-09-17 DIAGNOSIS — Z1509 Genetic susceptibility to other malignant neoplasm: Secondary | ICD-10-CM

## 2022-09-17 DIAGNOSIS — Z0000 Encounter for general adult medical examination without abnormal findings: Secondary | ICD-10-CM | POA: Diagnosis not present

## 2022-09-17 LAB — HM HEPATITIS C SCREENING LAB: HM Hepatitis Screen: NEGATIVE

## 2022-09-17 LAB — HM HIV SCREENING LAB: HM HIV Screening: NEGATIVE

## 2022-09-17 NOTE — Assessment & Plan Note (Signed)
She had double mastectomy and hysterectomy with oophorectomy.

## 2022-09-17 NOTE — Progress Notes (Signed)
New Patient Visit  BP 118/74 (BP Location: Left Arm, Patient Position: Sitting, Cuff Size: Normal)   Pulse 83   Temp (!) 97.5 F (36.4 C) (Temporal)   Wt 117 lb 12.8 oz (53.4 kg)   SpO2 99%   BMI 23.01 kg/m    Subjective:    Patient ID: Tammy Gilmore, female    DOB: 20-Sep-1971, 51 y.o.   MRN: [id number]  CC: Chief Complaint  Patient presents with   Establish Care    Np. Est care. Overall health assessment. No main concerns.     HPI: Tammy Gilmore is a 51 y.o. female presents to transfer care to a new provider.  Introduced to Publishing rights manager role and practice setting.  All questions answered.  Discussed provider/patient relationship and expectations.   Past Medical History:  Diagnosis Date   Allergy Seasonal   Anxiety Comes And goes   BRCA gene positive    BRCA 2   Hyperlipidemia     Past Surgical History:  Procedure Laterality Date   ABDOMINAL HYSTERECTOMY     ovaries and fallopian tubes   BREAST SURGERY  2013   CESAREAN SECTION  08/10/01   double masectomy  2011   MASTECTOMY      Family History  Problem Relation Age of Onset   Cancer Mother        breast   Cancer Father        lung   Hearing loss Father    Cancer Brother        gall bladder   Arthritis Maternal Grandmother    Stroke Maternal Grandmother    Breast cancer Paternal Grandmother    Anxiety disorder Paternal Grandmother      Social History   Tobacco Use   Smoking status: Never   Smokeless tobacco: Never  Vaping Use   Vaping Use: Never used  Substance Use Topics   Alcohol use: Yes    Alcohol/week: 3.0 standard drinks of alcohol    Types: 3 Glasses of wine per week    Comment: occ   Drug use: Never    Current Outpatient Medications on File Prior to Visit  Medication Sig Dispense Refill   cyclobenzaprine (FLEXERIL) 10 MG tablet Take 10 mg by mouth at bedtime.     Multiple Vitamin (MULTI-VITAMIN) tablet Take by mouth.     zinc gluconate 50 MG tablet Take by mouth.      No current facility-administered medications on file prior to visit.     Review of Systems  Constitutional: Negative.   HENT:  Positive for hearing loss (deaf right ear).   Eyes: Negative.   Respiratory: Negative.    Cardiovascular: Negative.   Gastrointestinal: Negative.   Genitourinary: Negative.   Musculoskeletal: Negative.   Skin: Negative.   Neurological: Negative.   Psychiatric/Behavioral: Negative.        Objective:    BP 118/74 (BP Location: Left Arm, Patient Position: Sitting, Cuff Size: Normal)   Pulse 83   Temp (!) 97.5 F (36.4 C) (Temporal)   Wt 117 lb 12.8 oz (53.4 kg)   SpO2 99%   BMI 23.01 kg/m   Wt Readings from Last 3 Encounters:  09/17/22 117 lb 12.8 oz (53.4 kg)  05/04/22 113 lb (51.3 kg)  06/12/21 114 lb 12.8 oz (52.1 kg)    BP Readings from Last 3 Encounters:  09/17/22 118/74  05/04/22 (!) 142/78  06/12/21 (!) 100/48    Physical Exam Vitals and nursing note reviewed.  Constitutional:      General: She is not in acute distress.    Appearance: Normal appearance.  HENT:     Head: Normocephalic and atraumatic.     Right Ear: Tympanic membrane, ear canal and external ear normal.     Left Ear: Tympanic membrane, ear canal and external ear normal.  Eyes:     Conjunctiva/sclera: Conjunctivae normal.  Cardiovascular:     Rate and Rhythm: Normal rate and regular rhythm.     Pulses: Normal pulses.     Heart sounds: Normal heart sounds.  Pulmonary:     Effort: Pulmonary effort is normal.     Breath sounds: Normal breath sounds.  Abdominal:     Palpations: Abdomen is soft.     Tenderness: There is no abdominal tenderness.  Musculoskeletal:        General: Normal range of motion.     Cervical back: Normal range of motion and neck supple.     Right lower leg: No edema.     Left lower leg: No edema.  Lymphadenopathy:     Cervical: No cervical adenopathy.  Skin:    General: Skin is warm and dry.  Neurological:     General: No focal deficit  present.     Mental Status: She is alert and oriented to person, place, and time.     Cranial Nerves: No cranial nerve deficit.     Coordination: Coordination normal.     Gait: Gait normal.  Psychiatric:        Mood and Affect: Mood normal.        Behavior: Behavior normal.        Thought Content: Thought content normal.        Judgment: Judgment normal.        Assessment & Plan:   Problem List Items Addressed This Visit       Other   BRCA2 positive    She had double mastectomy and hysterectomy with oophorectomy.       Hypercholesterolemia    Controlled with diet. Check fasting lipid panel.       Relevant Orders   Lipid panel   Vitamin D deficiency    History of vitamin D deficiency, will check levels and treat based on results.       Relevant Orders   VITAMIN D 25 Hydroxy (Vit-D Deficiency, Fractures)   Other Visit Diagnoses     Routine general medical examination at a health care facility    -  Primary   Health maintenance reviewed and updated. Discussed nutrition, exercise. She will return for fasting labs. Follow-up 1 year.    Relevant Orders   CBC   Comprehensive metabolic panel   Need for influenza vaccination       Flu vaccine given today   Relevant Orders   Flu Vaccine QUAD 6+ mos PF IM (Fluarix Quad PF)   IFG (impaired fasting glucose)       Will check A1c.    Relevant Orders   Comprehensive metabolic panel   Hemoglobin A1c   Need for shingles vaccine       She will plan a nurse visit for first shingrix vaccine    Relevant Orders   Varicella-zoster vaccine IM        Follow up plan: Return in about 1 year (around 09/18/2023) for CPE.

## 2022-09-17 NOTE — Assessment & Plan Note (Signed)
Controlled with diet. Check fasting lipid panel.

## 2022-09-17 NOTE — Patient Instructions (Signed)
It was great to see you!  You can schedule a lab visit for fasting labs (6-8 hours) and a nurse visit for your shingles vaccine (this can be the same time)  Let's follow-up in 1 year, sooner if you have concerns.  If a referral was placed today, you will be contacted for an appointment. Please note that routine referrals can sometimes take up to 3-4 weeks to process. Please call our office if you haven't heard anything after this time frame.  Take care,  Vance Peper, NP

## 2022-09-17 NOTE — Assessment & Plan Note (Signed)
History of vitamin D deficiency, will check levels and treat based on results.

## 2022-09-21 DIAGNOSIS — L821 Other seborrheic keratosis: Secondary | ICD-10-CM | POA: Diagnosis not present

## 2022-09-21 DIAGNOSIS — L570 Actinic keratosis: Secondary | ICD-10-CM | POA: Diagnosis not present

## 2022-09-21 DIAGNOSIS — L814 Other melanin hyperpigmentation: Secondary | ICD-10-CM | POA: Diagnosis not present

## 2022-09-21 DIAGNOSIS — D225 Melanocytic nevi of trunk: Secondary | ICD-10-CM | POA: Diagnosis not present

## 2022-09-21 DIAGNOSIS — Z85828 Personal history of other malignant neoplasm of skin: Secondary | ICD-10-CM | POA: Diagnosis not present

## 2022-09-23 ENCOUNTER — Other Ambulatory Visit (INDEPENDENT_AMBULATORY_CARE_PROVIDER_SITE_OTHER): Payer: BC Managed Care – PPO

## 2022-09-23 DIAGNOSIS — Z0000 Encounter for general adult medical examination without abnormal findings: Secondary | ICD-10-CM

## 2022-09-23 DIAGNOSIS — R7301 Impaired fasting glucose: Secondary | ICD-10-CM | POA: Diagnosis not present

## 2022-09-23 DIAGNOSIS — Z23 Encounter for immunization: Secondary | ICD-10-CM | POA: Diagnosis not present

## 2022-09-23 DIAGNOSIS — E7800 Pure hypercholesterolemia, unspecified: Secondary | ICD-10-CM | POA: Diagnosis not present

## 2022-09-23 DIAGNOSIS — E559 Vitamin D deficiency, unspecified: Secondary | ICD-10-CM

## 2022-09-23 LAB — COMPREHENSIVE METABOLIC PANEL
ALT: 25 U/L (ref 0–35)
AST: 18 U/L (ref 0–37)
Albumin: 4.8 g/dL (ref 3.5–5.2)
Alkaline Phosphatase: 58 U/L (ref 39–117)
BUN: 13 mg/dL (ref 6–23)
CO2: 29 mEq/L (ref 19–32)
Calcium: 9.4 mg/dL (ref 8.4–10.5)
Chloride: 103 mEq/L (ref 96–112)
Creatinine, Ser: 0.62 mg/dL (ref 0.40–1.20)
GFR: 101.66 mL/min (ref >60.00)
Glucose, Bld: 96 mg/dL (ref 70–99)
Potassium: 4.2 mEq/L (ref 3.5–5.1)
Sodium: 140 mEq/L (ref 135–145)
Total Bilirubin: 0.5 mg/dL (ref 0.2–1.2)
Total Protein: 7.2 g/dL (ref 6.0–8.3)

## 2022-09-23 LAB — LIPID PANEL
Cholesterol: 251 mg/dL — ABNORMAL HIGH (ref 0–200)
HDL: 79.6 mg/dL (ref >39.00)
LDL Cholesterol: 159 mg/dL — ABNORMAL HIGH (ref 0–99)
NonHDL: 171.34
Total CHOL/HDL Ratio: 3
Triglycerides: 63 mg/dL (ref 0.0–149.0)
VLDL: 12.6 mg/dL (ref 0.0–40.0)

## 2022-09-23 LAB — VITAMIN D 25 HYDROXY (VIT D DEFICIENCY, FRACTURES): VITD: 34.01 ng/mL (ref 30.00–100.00)

## 2022-09-23 LAB — CBC
HCT: 36.9 % (ref 36.0–46.0)
Hemoglobin: 12.6 g/dL (ref 12.0–15.0)
MCHC: 34 g/dL (ref 30.0–36.0)
MCV: 91.4 fl (ref 78.0–100.0)
Platelets: 397 10*3/uL (ref 150.0–400.0)
RBC: 4.04 Mil/uL (ref 3.87–5.11)
RDW: 12.3 % (ref 11.5–15.5)
WBC: 5.5 10*3/uL (ref 4.0–10.5)

## 2022-09-23 LAB — HEMOGLOBIN A1C: Hgb A1c MFr Bld: 5.8 % (ref 4.6–6.5)

## 2022-09-27 ENCOUNTER — Encounter: Payer: Self-pay | Admitting: Nurse Practitioner

## 2022-09-30 MED ORDER — HYDROXYZINE HCL 10 MG PO TABS: 10.0000 mg | ORAL_TABLET | Freq: Three times a day (TID) | ORAL | 1 refills | Status: DC | PRN

## 2022-10-04 DIAGNOSIS — F411 Generalized anxiety disorder: Secondary | ICD-10-CM | POA: Diagnosis not present

## 2022-10-04 DIAGNOSIS — R197 Diarrhea, unspecified: Secondary | ICD-10-CM | POA: Diagnosis not present

## 2022-10-04 DIAGNOSIS — R079 Chest pain, unspecified: Secondary | ICD-10-CM | POA: Diagnosis not present

## 2022-10-12 ENCOUNTER — Encounter: Payer: Self-pay | Admitting: Nurse Practitioner

## 2022-11-22 IMAGING — CR DG CHEST 2V
2 series · 2 of 2 positions shown · non-contrast
Comparison: Chest radiograph 05/26/2020

CLINICAL DATA: Chest tightness

EXAM:
CHEST - 2 VIEW

[w chest lat]
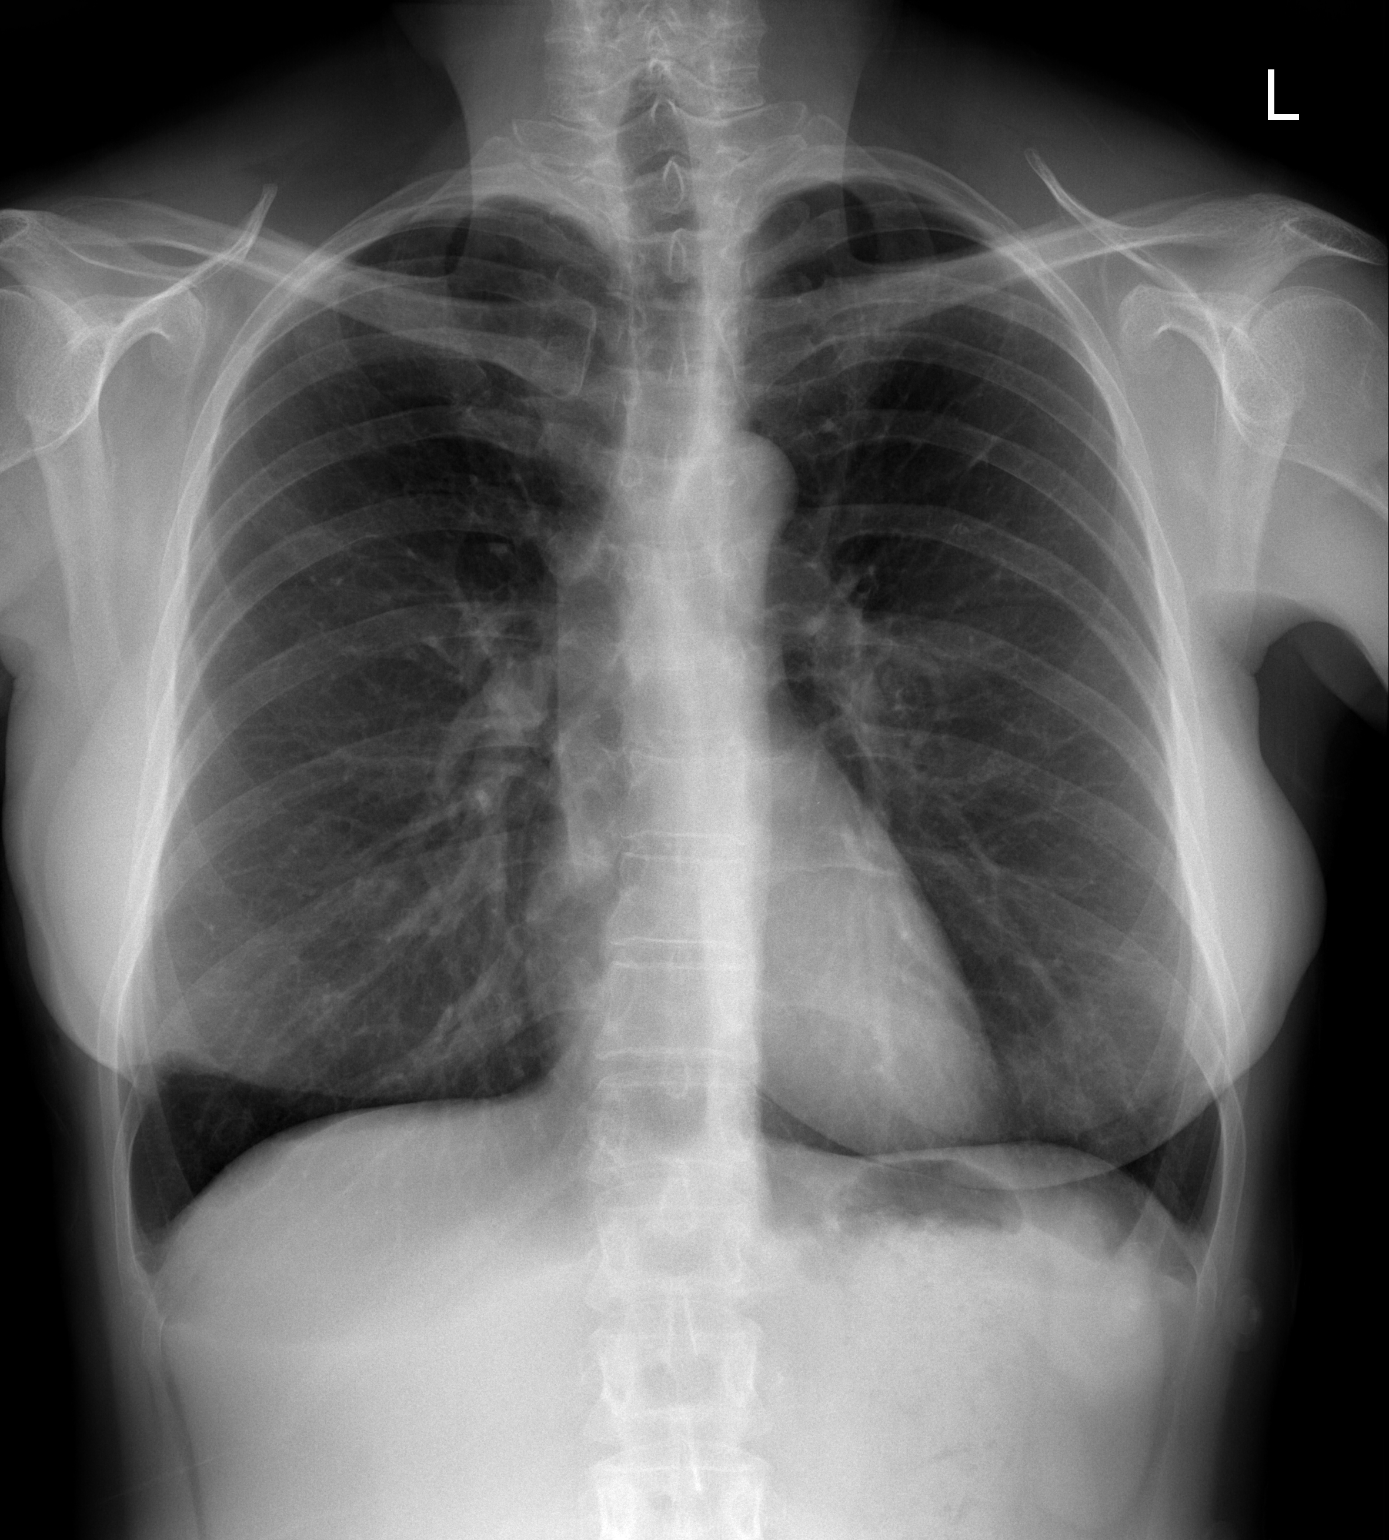

[w chest ap]
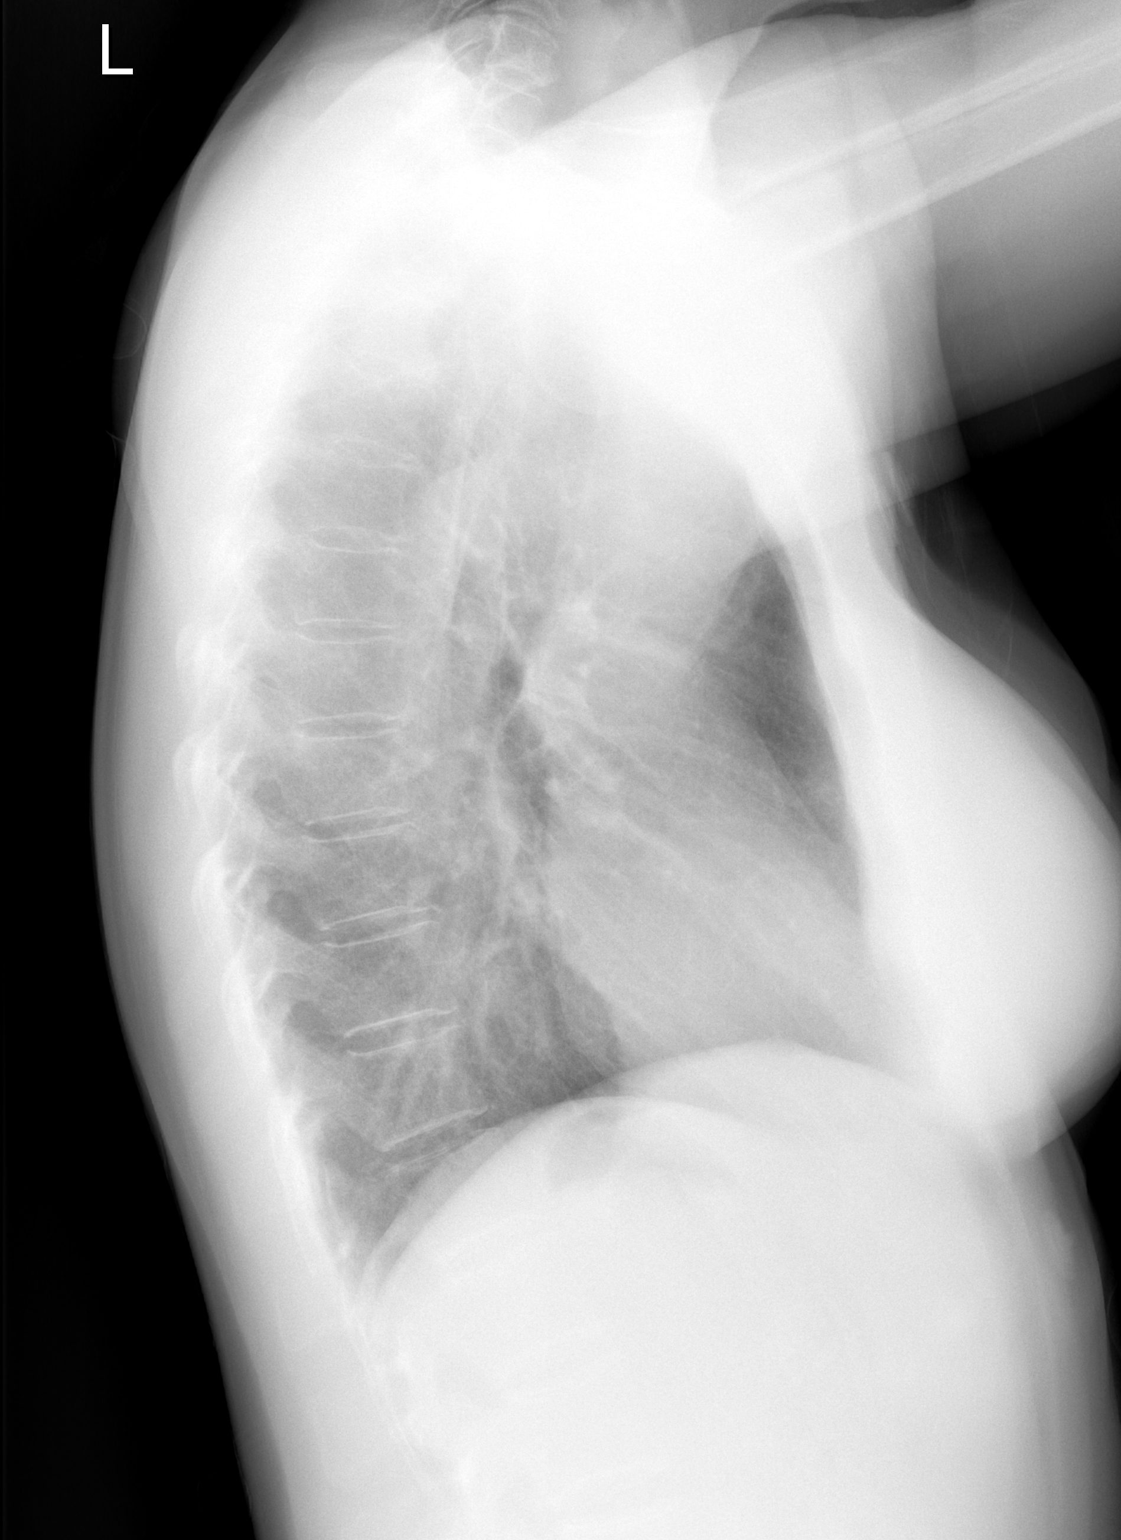

[2 of 2 positions shown; findings below may reference images not displayed]

FINDINGS: The cardiomediastinal silhouette is normal.

There is no focal consolidation or pulmonary edema. There is no
pleural effusion or pneumothorax

There is no acute osseous abnormality.
IMPRESSION: No radiographic evidence of acute cardiopulmonary process.

## 2022-12-18 ENCOUNTER — Encounter: Payer: Self-pay | Admitting: Family Medicine

## 2022-12-18 ENCOUNTER — Ambulatory Visit: Payer: No Typology Code available for payment source | Admitting: Family Medicine

## 2022-12-18 VITALS — BP 108/68 | HR 68 | Temp 98.5°F | Ht 60.0 in | Wt 115.6 lb

## 2022-12-18 DIAGNOSIS — H6993 Unspecified Eustachian tube disorder, bilateral: Secondary | ICD-10-CM

## 2022-12-18 DIAGNOSIS — U071 COVID-19: Secondary | ICD-10-CM

## 2022-12-18 DIAGNOSIS — B349 Viral infection, unspecified: Secondary | ICD-10-CM

## 2022-12-18 LAB — POCT INFLUENZA A/B
Influenza A, POC: NEGATIVE
Influenza B, POC: NEGATIVE

## 2022-12-18 LAB — POC COVID19 BINAXNOW: SARS Coronavirus 2 Ag: POSITIVE — AB

## 2022-12-18 MED ORDER — PSEUDOEPHEDRINE-GUAIFENESIN ER 60-600 MG PO TB12: 1.0000 | ORAL_TABLET | Freq: Two times a day (BID) | ORAL | 0 refills | Status: DC

## 2022-12-18 MED ORDER — PREDNISONE 10 MG PO TABS: 10.0000 mg | ORAL_TABLET | Freq: Two times a day (BID) | ORAL | 0 refills | Status: AC

## 2022-12-18 NOTE — Progress Notes (Signed)
Established Patient Office Visit   Subjective:  Patient ID: Tammy Gilmore, female    DOB: 1971/03/20  Age: 52 y.o. MRN: [id number]  Chief Complaint  Patient presents with   Sinus Problem    Sinus pressure with drainage, body aches exposed to flu, sore throat symptoms x 2 days.     Sinus Problem Associated symptoms include congestion, coughing and a sore throat. Pertinent negatives include no shortness of breath.   Encounter Diagnoses  Name Primary?   Viral syndrome Yes   COVID-19    Dysfunction of both eustachian tubes    2-day history of frontal headache, malaise, fatigue, postnasal drip, sore throat, body aches and ear congestion.  Denies wheezing or reactive airway disease.  Her son had flu last week.  Recent exposure to COVID.   Review of Systems  Constitutional:  Positive for malaise/fatigue.  HENT:  Positive for congestion, sinus pain and sore throat.   Eyes:  Negative for blurred vision, discharge and redness.  Respiratory:  Positive for cough. Negative for sputum production, shortness of breath and wheezing.   Cardiovascular: Negative.   Gastrointestinal:  Negative for abdominal pain.  Genitourinary: Negative.   Musculoskeletal:  Positive for joint pain and myalgias.  Skin:  Negative for rash.  Neurological:  Negative for tingling, loss of consciousness and weakness.  Endo/Heme/Allergies:  Negative for polydipsia.     Current Outpatient Medications:    hydrOXYzine (ATARAX) 10 MG tablet, Take 1 tablet (10 mg total) by mouth 3 (three) times daily as needed., Disp: 30 tablet, Rfl: 1   Multiple Vitamin (MULTI-VITAMIN) tablet, Take by mouth., Disp: , Rfl:    predniSONE (DELTASONE) 10 MG tablet, Take 1 tablet (10 mg total) by mouth 2 (two) times daily with a meal for 5 days., Disp: 10 tablet, Rfl: 0   pseudoephedrine-guaifenesin (MUCINEX D) 60-600 MG 12 hr tablet, Take 1 tablet by mouth every 12 (twelve) hours., Disp: 20 tablet, Rfl: 0   Objective:     BP 108/68  (BP Location: Left Arm, Patient Position: Sitting, Cuff Size: Normal)   Pulse 68   Temp 98.5 F (36.9 C) (Temporal)   Ht 5' (1.524 m)   Wt 115 lb 9.6 oz (52.4 kg)   SpO2 98%   BMI 22.58 kg/m    Physical Exam Constitutional:      General: She is not in acute distress.    Appearance: Normal appearance. She is not ill-appearing, toxic-appearing or diaphoretic.  HENT:     Head: Normocephalic and atraumatic.     Right Ear: Ear canal and external ear normal. No middle ear effusion. Tympanic membrane is retracted. Tympanic membrane is not erythematous.     Left Ear: Ear canal and external ear normal.  No middle ear effusion. Tympanic membrane is retracted. Tympanic membrane is not erythematous.     Mouth/Throat:     Mouth: Mucous membranes are moist.     Pharynx: Oropharynx is clear. No oropharyngeal exudate or posterior oropharyngeal erythema.  Eyes:     General: No scleral icterus.       Right eye: No discharge.        Left eye: No discharge.     Extraocular Movements: Extraocular movements intact.     Conjunctiva/sclera: Conjunctivae normal.     Pupils: Pupils are equal, round, and reactive to light.  Cardiovascular:     Rate and Rhythm: Normal rate and regular rhythm.  Pulmonary:     Effort: Pulmonary effort is normal. No respiratory distress.  Breath sounds: Normal breath sounds. No wheezing or rales.  Abdominal:     General: Bowel sounds are normal.     Tenderness: There is no abdominal tenderness. There is no guarding.  Musculoskeletal:     Cervical back: No rigidity or tenderness.  Lymphadenopathy:     Cervical: No cervical adenopathy.  Skin:    General: Skin is warm and dry.  Neurological:     Mental Status: She is alert and oriented to person, place, and time.  Psychiatric:        Mood and Affect: Mood normal.        Behavior: Behavior normal.      Results for orders placed or performed in visit on 12/18/22  POC COVID-19  Result Value Ref Range   SARS  Coronavirus 2 Ag Positive (A) Negative  POCT Influenza A/B  Result Value Ref Range   Influenza A, POC Negative Negative   Influenza B, POC Negative Negative      The 10-year ASCVD risk score (Arnett DK, et al., 2019) is: 1%    Assessment & Plan:   Viral syndrome -     POC COVID-19 BinaxNow -     POCT Influenza A/B  COVID-19 -     Pseudoephedrine-guaiFENesin ER; Take 1 tablet by mouth every 12 (twelve) hours.  Dispense: 20 tablet; Refill: 0  Dysfunction of both eustachian tubes -     predniSONE; Take 1 tablet (10 mg total) by mouth 2 (two) times daily with a meal for 5 days.  Dispense: 10 tablet; Refill: 0 -     Pseudoephedrine-guaiFENesin ER; Take 1 tablet by mouth every 12 (twelve) hours.  Dispense: 20 tablet; Refill: 0    Return in about 1 week (around 12/25/2022), or if symptoms worsen or fail to improve.  Will quarantine for 5 days.  Rest.  Lots of fluids.  Prednisone 10 twice daily for congestion and eustachian tube dysfunction.  Follow-up in 1 week if not improving.  Libby Maw, MD

## 2022-12-25 ENCOUNTER — Telehealth: Payer: Self-pay | Admitting: Nurse Practitioner

## 2022-12-25 NOTE — Telephone Encounter (Signed)
I spoke with patient and she just wanted to be reassured. She said that she feels ok and had went back to her office to do some work and thinks that she is pushing herself too soon. Patient thanked me for calling and said that if any changes she will go to urgent care this weekend. She said she does not have any pain now.

## 2022-12-25 NOTE — Telephone Encounter (Signed)
Patient called and had an appointment on 12/18/22 and said she had tested positive for covid. She said she tested again yesterday and it was negative. She said she was feeling better but now her right shoulder is hurting and she wasn't sure if it could be any relation. She said said she still doesn't have taste and still has fatigue. She would like a call back at [phone]

## 2022-12-31 ENCOUNTER — Encounter: Payer: Self-pay | Admitting: Nurse Practitioner

## 2022-12-31 ENCOUNTER — Ambulatory Visit: Payer: No Typology Code available for payment source | Admitting: Nurse Practitioner

## 2022-12-31 VITALS — BP 100/68 | HR 70 | Temp 98.3°F | Resp 14 | Ht 60.0 in | Wt 118.0 lb

## 2022-12-31 DIAGNOSIS — H6993 Unspecified Eustachian tube disorder, bilateral: Secondary | ICD-10-CM

## 2022-12-31 MED ORDER — LORATADINE 10 MG PO TABS: 10.0000 mg | ORAL_TABLET | Freq: Every day | ORAL | Status: DC

## 2022-12-31 MED ORDER — METHYLPREDNISOLONE 4 MG PO TBPK: ORAL_TABLET | ORAL | 0 refills | Status: DC

## 2022-12-31 NOTE — Progress Notes (Addendum)
                Established Patient Visit  Patient: Tammy Gilmore   DOB: 13-Mar-1971   52 y.o. Female  MRN: [id number] Visit Date: 12/31/2022  Subjective:    Chief Complaint  Patient presents with   Acute Visit    Possible ear infection. Left ear pain    Otalgia  There is pain in the left ear. This is a new problem. The current episode started yesterday. The problem occurs constantly. The problem has been unchanged. There has been no fever. Pertinent negatives include no abdominal pain, coughing, diarrhea, ear discharge, headaches, hearing loss, neck pain, rash, rhinorrhea, sore throat or vomiting. She has tried nothing for the symptoms. Her past medical history is significant for hearing loss and a tympanostomy tube. There is no history of a chronic ear infection.   Reviewed medical, surgical, and social history today  Medications: Outpatient Medications Prior to Visit  Medication Sig   hydrOXYzine (ATARAX) 10 MG tablet Take 1 tablet (10 mg total) by mouth 3 (three) times daily as needed.   Multiple Vitamin (MULTI-VITAMIN) tablet Take by mouth.   pseudoephedrine-guaifenesin (MUCINEX D) 60-600 MG 12 hr tablet Take 1 tablet by mouth every 12 (twelve) hours. (Patient not taking: Reported on 12/31/2022)   No facility-administered medications prior to visit.   Reviewed past medical and social history.   ROS per HPI above      Objective:  BP 100/68   Pulse 70   Temp 98.3 F (36.8 C) (Oral)   Resp 14   Ht 5' (1.524 m)   Wt 118 lb (53.5 kg)   SpO2 98%   BMI 23.05 kg/m      Physical Exam Vitals reviewed.  HENT:     Head: Normocephalic.     Jaw: There is normal jaw occlusion.     Salivary Glands: Right salivary gland is not diffusely enlarged or tender. Left salivary gland is not diffusely enlarged or tender.     Right Ear: Decreased hearing noted. A middle ear effusion is present. There is no impacted cerumen. Tympanic membrane is not injected, scarred, perforated,  erythematous or retracted.     Left Ear: Ear canal and external ear normal. No drainage. A middle ear effusion is present. There is no impacted cerumen. Tympanic membrane is retracted. Tympanic membrane is not injected, scarred, perforated, erythematous or bulging.  Cardiovascular:     Rate and Rhythm: Normal rate.     Pulses: Normal pulses.  Pulmonary:     Effort: Pulmonary effort is normal.  Musculoskeletal:     Cervical back: Normal range of motion and neck supple.  Lymphadenopathy:     Cervical: No cervical adenopathy.  Neurological:     Mental Status: She is alert.     No results found for any visits on 12/31/22.    Assessment & Plan:    Problem List Items Addressed This Visit   None Visit Diagnoses     Dysfunction of both eustachian tubes    -  Primary   Relevant Medications   loratadine (CLARITIN) 10 MG tablet   methylPREDNISolone (MEDROL DOSEPAK) 4 MG TBPK tablet      Return if symptoms worsen or fail to improve.     Wilfred Lacy, NP

## 2022-12-31 NOTE — Patient Instructions (Signed)
If no improvement, you will need an appt with ENT. Take medrol dose pack and claritin or zyrtec 1tab daily.  Eustachian Tube Dysfunction  Eustachian tube dysfunction refers to a condition in which a blockage develops in the narrow passage that connects the middle ear to the back of the nose (eustachian tube). The eustachian tube regulates air pressure in the middle ear by letting air move between the ear and nose. It also helps to drain fluid from the middle ear space. Eustachian tube dysfunction can affect one or both ears. When the eustachian tube does not function properly, air pressure, fluid, or both can build up in the middle ear. What are the causes? This condition occurs when the eustachian tube becomes blocked or cannot open normally. Common causes of this condition include: Ear infections. Colds and other infections that affect the nose, mouth, and throat (upper respiratory tract). Allergies. Irritation from cigarette smoke. Irritation from stomach acid coming up into the esophagus (gastroesophageal reflux). The esophagus is the part of the body that moves food from the mouth to the stomach. Sudden changes in air pressure, such as from descending in an airplane or scuba diving. Abnormal growths in the nose or throat, such as: Growths that line the nose (nasal polyps). Abnormal growth of cells (tumors). Enlarged tissue at the back of the throat (adenoids). What increases the risk? You are more likely to develop this condition if: You smoke. You are overweight. You are a child who has: Certain birth defects of the mouth, such as cleft palate. Large tonsils or adenoids. What are the signs or symptoms? Common symptoms of this condition include: A feeling of fullness in the ear. Ear pain. Clicking or popping noises in the ear. Ringing in the ear (tinnitus). Hearing loss. Loss of balance. Dizziness. Symptoms may get worse when the air pressure around you changes, such as when  you travel to an area of high elevation, fly on an airplane, or go scuba diving. How is this diagnosed? This condition may be diagnosed based on: Your symptoms. A physical exam of your ears, nose, and throat. Tests, such as those that measure: The movement of your eardrum. Your hearing (audiometry). How is this treated? Treatment depends on the cause and severity of your condition. In mild cases, you may relieve your symptoms by moving air into your ears. This is called "popping the ears." In more severe cases, or if you have symptoms of fluid in your ears, treatment may include: Medicines to relieve congestion (decongestants). Medicines that treat allergies (antihistamines). Nasal sprays or ear drops that contain medicines that reduce swelling (steroids). A procedure to drain the fluid in your eardrum. In this procedure, a small tube may be placed in the eardrum to: Drain the fluid. Restore the air in the middle ear space. A procedure to insert a balloon device through the nose to inflate the opening of the eustachian tube (balloon dilation). Follow these instructions at home: Lifestyle Do not do any of the following until your health care provider approves: Travel to high altitudes. Fly in airplanes. Work in a Pension scheme manager or room. Scuba dive. Do not use any products that contain nicotine or tobacco. These products include cigarettes, chewing tobacco, and vaping devices, such as e-cigarettes. If you need help quitting, ask your health care provider. Keep your ears dry. Wear fitted earplugs during showering and bathing. Dry your ears completely after. General instructions Take over-the-counter and prescription medicines only as told by your health care provider. Use techniques  to help pop your ears as recommended by your health care provider. These may include: Chewing gum. Yawning. Frequent, forceful swallowing. Closing your mouth, holding your nose closed, and gently blowing  as if you are trying to blow air out of your nose. Keep all follow-up visits. This is important. Contact a health care provider if: Your symptoms do not go away after treatment. Your symptoms come back after treatment. You are unable to pop your ears. You have: A fever. Pain in your ear. Pain in your head or neck. Fluid draining from your ear. Your hearing suddenly changes. You become very dizzy. You lose your balance. Get help right away if: You have a sudden, severe increase in any of your symptoms. Summary Eustachian tube dysfunction refers to a condition in which a blockage develops in the eustachian tube. It can be caused by ear infections, allergies, inhaled irritants, or abnormal growths in the nose or throat. Symptoms may include ear pain or fullness, hearing loss, or ringing in the ears. Mild cases are treated with techniques to unblock the ears, such as yawning or chewing gum. More severe cases are treated with medicines or procedures. This information is not intended to replace advice given to you by your health care provider. Make sure you discuss any questions you have with your health care provider. Document Revised: 01/20/2021 Document Reviewed: 01/20/2021 Elsevier Patient Education  [place].

## 2023-02-15 ENCOUNTER — Telehealth: Payer: Self-pay | Admitting: Nurse Practitioner

## 2023-02-15 ENCOUNTER — Ambulatory Visit: Payer: No Typology Code available for payment source | Admitting: Nurse Practitioner

## 2023-02-15 VITALS — BP 110/84 | HR 82 | Temp 98.9°F | Ht 60.0 in | Wt 114.8 lb

## 2023-02-15 DIAGNOSIS — R7303 Prediabetes: Secondary | ICD-10-CM

## 2023-02-15 DIAGNOSIS — R002 Palpitations: Secondary | ICD-10-CM | POA: Diagnosis not present

## 2023-02-15 NOTE — Telephone Encounter (Signed)
Pt called and wanted to know if she can come in today for a blood draw

## 2023-02-15 NOTE — Telephone Encounter (Signed)
I spoke with patient and she was requesting for lab to be drawn. I scheduled patient for an office for this afternoon.

## 2023-02-15 NOTE — Progress Notes (Signed)
EKG interpreted by me on 02/15/23 showed normal sinus rhythm with a heart rate of 75. No ST or T wave changes.

## 2023-02-15 NOTE — Progress Notes (Unsigned)
   Established Patient Office Visit  Subjective   Patient ID: Tammy Gilmore, female    DOB: [birth year]  Age: 52 y.o. MRN: [id number]  Chief Complaint  Patient presents with   Request Lab Work    Wants lab work to check    HPI  {History (Optional):23778}  ROS    Objective:     BP 110/84 (BP Location: Right Arm)   Pulse 82   Temp 98.9 F (37.2 C)   Ht 5' (1.524 m)   Wt 114 lb 12.8 oz (52.1 kg)   SpO2 97%   BMI 22.42 kg/m  {Vitals History (Optional):23777}  Physical Exam   No results found for any visits on 02/15/23.  {Labs (Optional):23779}  The 10-year ASCVD risk score (Arnett DK, et al., 2019) is: 1.1%    Assessment & Plan:   Problem List Items Addressed This Visit   None   No follow-ups on file.    Charyl Dancer, NP

## 2023-02-15 NOTE — Patient Instructions (Signed)
It was great to see you!  We are checking your labs today and will let you know the results via mychart/phone.   Keep drinking plenty of water and fluids.   Let's follow-up based on your lab results, sooner if you have concerns.  If a referral was placed today, you will be contacted for an appointment. Please note that routine referrals can sometimes take up to 3-4 weeks to process. Please call our office if you haven't heard anything after this time frame.  Take care,  Vance Peper, NP

## 2023-02-16 DIAGNOSIS — R002 Palpitations: Secondary | ICD-10-CM | POA: Insufficient documentation

## 2023-02-16 DIAGNOSIS — R7303 Prediabetes: Secondary | ICD-10-CM | POA: Insufficient documentation

## 2023-02-16 LAB — CBC WITH DIFFERENTIAL/PLATELET
Basophils Absolute: 0.1 10*3/uL (ref 0.0–0.1)
Basophils Relative: 1 % (ref 0.0–3.0)
Eosinophils Absolute: 0.1 10*3/uL (ref 0.0–0.7)
Eosinophils Relative: 1 % (ref 0.0–5.0)
HCT: 36 % (ref 36.0–46.0)
Hemoglobin: 12.4 g/dL (ref 12.0–15.0)
Lymphocytes Relative: 38.2 % (ref 12.0–46.0)
Lymphs Abs: 2.6 10*3/uL (ref 0.7–4.0)
MCHC: 34.3 g/dL (ref 30.0–36.0)
MCV: 91 fl (ref 78.0–100.0)
Monocytes Absolute: 0.5 10*3/uL (ref 0.1–1.0)
Monocytes Relative: 7.4 % (ref 3.0–12.0)
Neutro Abs: 3.5 10*3/uL (ref 1.4–7.7)
Neutrophils Relative %: 52.4 % (ref 43.0–77.0)
Platelets: 425 10*3/uL — ABNORMAL HIGH (ref 150.0–400.0)
RBC: 3.96 Mil/uL (ref 3.87–5.11)
RDW: 12.7 % (ref 11.5–15.5)
WBC: 6.7 10*3/uL (ref 4.0–10.5)

## 2023-02-16 LAB — COMPREHENSIVE METABOLIC PANEL
ALT: 16 U/L (ref 0–35)
AST: 13 U/L (ref 0–37)
Albumin: 4.7 g/dL (ref 3.5–5.2)
Alkaline Phosphatase: 59 U/L (ref 39–117)
BUN: 19 mg/dL (ref 6–23)
CO2: 28 mEq/L (ref 19–32)
Calcium: 9.6 mg/dL (ref 8.4–10.5)
Chloride: 103 mEq/L (ref 96–112)
Creatinine, Ser: 0.58 mg/dL (ref 0.40–1.20)
GFR: 103.02 mL/min (ref >60.00)
Glucose, Bld: 103 mg/dL — ABNORMAL HIGH (ref 70–99)
Potassium: 4.3 mEq/L (ref 3.5–5.1)
Sodium: 138 mEq/L (ref 135–145)
Total Bilirubin: 0.4 mg/dL (ref 0.2–1.2)
Total Protein: 6.9 g/dL (ref 6.0–8.3)

## 2023-02-16 LAB — IRON,TIBC AND FERRITIN PANEL
%SAT: 21 % (calc) (ref 16–45)
Ferritin: 33 ng/mL (ref 16–232)
Iron: 65 ug/dL (ref 45–160)
TIBC: 315 mcg/dL (calc) (ref 250–450)

## 2023-02-16 LAB — VITAMIN B12: Vitamin B-12: 759 pg/mL (ref 211–911)

## 2023-02-16 LAB — HEMOGLOBIN A1C: Hgb A1c MFr Bld: 5.6 % (ref 4.6–6.5)

## 2023-02-16 LAB — TSH: TSH: 0.96 u[IU]/mL (ref 0.35–5.50)

## 2023-02-16 NOTE — Assessment & Plan Note (Signed)
She has been experiencing palpitations, increased anxiety, and dizziness intermittently over the last month. EKG in office today shows normal sinus rhythm with a heart rate of 75, no ST or T wave changes. Will check CMP, CBC, iron panel, TSH, and vitamin B12 today. Encouraged her to continue drinking plenty of water and limiting caffeine. Follow-up based on lab results.

## 2023-02-16 NOTE — Assessment & Plan Note (Signed)
Check A1c today and treat based on results.  

## 2023-09-29 ENCOUNTER — Ambulatory Visit: Payer: No Typology Code available for payment source | Admitting: Nurse Practitioner

## 2023-09-29 ENCOUNTER — Encounter: Payer: Self-pay | Admitting: Nurse Practitioner

## 2023-09-29 VITALS — BP 102/70 | HR 79 | Temp 97.0°F | Ht 60.0 in | Wt 109.2 lb

## 2023-09-29 DIAGNOSIS — Z23 Encounter for immunization: Secondary | ICD-10-CM

## 2023-09-29 DIAGNOSIS — R7303 Prediabetes: Secondary | ICD-10-CM | POA: Diagnosis not present

## 2023-09-29 DIAGNOSIS — E559 Vitamin D deficiency, unspecified: Secondary | ICD-10-CM

## 2023-09-29 DIAGNOSIS — Z1211 Encounter for screening for malignant neoplasm of colon: Secondary | ICD-10-CM

## 2023-09-29 DIAGNOSIS — Z0000 Encounter for general adult medical examination without abnormal findings: Secondary | ICD-10-CM | POA: Diagnosis not present

## 2023-09-29 DIAGNOSIS — E7800 Pure hypercholesterolemia, unspecified: Secondary | ICD-10-CM

## 2023-09-29 LAB — COMPREHENSIVE METABOLIC PANEL
ALT: 13 U/L (ref 0–35)
AST: 12 U/L (ref 0–37)
Albumin: 4.7 g/dL (ref 3.5–5.2)
Alkaline Phosphatase: 70 U/L (ref 39–117)
BUN: 12 mg/dL (ref 6–23)
CO2: 29 meq/L (ref 19–32)
Calcium: 9.4 mg/dL (ref 8.4–10.5)
Chloride: 101 meq/L (ref 96–112)
Creatinine, Ser: 0.61 mg/dL (ref 0.40–1.20)
GFR: 101.34 mL/min (ref >60.00)
Glucose, Bld: 99 mg/dL (ref 70–99)
Potassium: 4 meq/L (ref 3.5–5.1)
Sodium: 138 meq/L (ref 135–145)
Total Bilirubin: 0.6 mg/dL (ref 0.2–1.2)
Total Protein: 6.9 g/dL (ref 6.0–8.3)

## 2023-09-29 LAB — CBC WITH DIFFERENTIAL/PLATELET
Basophils Absolute: 0 10*3/uL (ref 0.0–0.1)
Basophils Relative: 0.6 % (ref 0.0–3.0)
Eosinophils Absolute: 0.1 10*3/uL (ref 0.0–0.7)
Eosinophils Relative: 2.4 % (ref 0.0–5.0)
HCT: 37.8 % (ref 36.0–46.0)
Hemoglobin: 12.5 g/dL (ref 12.0–15.0)
Lymphocytes Relative: 43.8 % (ref 12.0–46.0)
Lymphs Abs: 2.1 10*3/uL (ref 0.7–4.0)
MCHC: 33 g/dL (ref 30.0–36.0)
MCV: 94 fL (ref 78.0–100.0)
Monocytes Absolute: 0.4 10*3/uL (ref 0.1–1.0)
Monocytes Relative: 7.3 % (ref 3.0–12.0)
Neutro Abs: 2.2 10*3/uL (ref 1.4–7.7)
Neutrophils Relative %: 45.9 % (ref 43.0–77.0)
Platelets: 423 10*3/uL — ABNORMAL HIGH (ref 150.0–400.0)
RBC: 4.02 Mil/uL (ref 3.87–5.11)
RDW: 12.4 % (ref 11.5–15.5)
WBC: 4.9 10*3/uL (ref 4.0–10.5)

## 2023-09-29 LAB — LIPID PANEL
Cholesterol: 227 mg/dL — ABNORMAL HIGH (ref 0–200)
HDL: 83.1 mg/dL (ref >39.00)
LDL Cholesterol: 131 mg/dL — ABNORMAL HIGH (ref 0–99)
NonHDL: 144.1
Total CHOL/HDL Ratio: 3
Triglycerides: 64 mg/dL (ref 0.0–149.0)
VLDL: 12.8 mg/dL (ref 0.0–40.0)

## 2023-09-29 LAB — HEMOGLOBIN A1C: Hgb A1c MFr Bld: 5.7 % (ref 4.6–6.5)

## 2023-09-29 LAB — VITAMIN D 25 HYDROXY (VIT D DEFICIENCY, FRACTURES): VITD: 45.84 ng/mL (ref 30.00–100.00)

## 2023-09-29 NOTE — Progress Notes (Signed)
BP 102/70 (BP Location: Left Arm)   Pulse 79   Temp (!) 97 F (36.1 C)   Ht 5' (1.524 m)   Wt 109 lb 3.2 oz (49.5 kg)   SpO2 100%   BMI 21.33 kg/m    Subjective:    Patient ID: Tammy Gilmore, female    DOB: [birth year], 52 y.o.   MRN: [id number]  CC: Chief Complaint  Patient presents with   Annual Exam    With fasting lab work, flu vaccine    HPI: Tammy Gilmore is a 52 y.o. female presenting on 09/29/2023 for comprehensive medical examination. Current medical complaints include: left ear pressure  Discussed the use of AI scribe software for clinical note transcription with the patient, who gave verbal consent to proceed.  History of Present Illness   The patient, with a history of palpitations, presents with improved palpitations and new left ear pressure. The ear pressure has been present for three weeks, is sometimes painful, and is associated with decreased hearing. She denies recent illness, cough, and sneezing. She cleans her ears with Q-tips, but does not insert them deeply. She has not noticed any improvement with chiropractic manipulation. She has a history of deafness and is concerned about further hearing loss.      She currently lives with: alone Menopausal Symptoms: no  Depression and Anxiety Screen done today and results listed below:     09/29/2023    9:23 AM 12/31/2022    1:28 PM 12/18/2022    4:07 PM 09/17/2022    3:28 PM 02/09/2020    8:58 AM  Depression screen PHQ 2/9  Decreased Interest 0 0 0 0 0  Down, Depressed, Hopeless 0 0 0 0 0  PHQ - 2 Score 0 0 0 0 0  Altered sleeping 0   1   Tired, decreased energy 0   0   Change in appetite 0   0   Feeling bad or failure about yourself  0   0   Trouble concentrating 0   0   Moving slowly or fidgety/restless 0   0   Suicidal thoughts 0   0   PHQ-9 Score 0   1   Difficult doing work/chores Not difficult at all          09/29/2023    9:23 AM 09/17/2022    3:28 PM  GAD 7 : Generalized Anxiety Score   Nervous, Anxious, on Edge 0 0  Control/stop worrying 0 0  Worry too much - different things 0 0  Trouble relaxing 0 0  Restless 0 0  Easily annoyed or irritable 0 0  Afraid - awful might happen 0 0  Total GAD 7 Score 0 0  Anxiety Difficulty Not difficult at all     The patient does not have a history of falls. I did not complete a risk assessment for falls. A plan of care for falls was not documented.   Past Medical History:  Past Medical History:  Diagnosis Date   Allergy Seasonal   Anxiety Comes And goes   BRCA gene positive    BRCA 2   Hyperlipidemia     Surgical History:  Past Surgical History:  Procedure Laterality Date   ABDOMINAL HYSTERECTOMY     ovaries and fallopian tubes   BREAST SURGERY  2013   CESAREAN SECTION  08/10/01   double masectomy  2011   MASTECTOMY      Medications:  Current Outpatient Medications on  File Prior to Visit  Medication Sig   Calcium Citrate-Vitamin D 315-5 MG-MCG TABS Take by mouth.   cyanocobalamin 1000 MCG tablet Take 1,000 mcg by mouth daily.   cyclobenzaprine (FLEXERIL) 10 MG tablet Take 10 mg by mouth as needed.   MAGNESIUM PO Take 200 mg by mouth daily.   Multiple Vitamin (MULTI-VITAMIN) tablet Take by mouth.   Omega-3 Fatty Acids (FISH OIL) 1000 MG CAPS Take 1,000 mg by mouth daily.   No current facility-administered medications on file prior to visit.    Allergies:  No Known Allergies  Social History:  Social History   Socioeconomic History   Marital status: Widowed    Spouse name: Not on file   Number of children: 2   Years of education: Not on file   Highest education level: Bachelor's degree (e.g., BA, AB, BS)  Occupational History   Not on file  Tobacco Use   Smoking status: Never   Smokeless tobacco: Never  Vaping Use   Vaping status: Never Used  Substance and Sexual Activity   Alcohol use: Yes    Alcohol/week: 3.0 standard drinks of alcohol    Types: 3 Glasses of wine per week    Comment: occ    Drug use: Never   Sexual activity: Not Currently    Birth control/protection: Surgical  Other Topics Concern   Not on file  Social History Narrative   Not on file   Social Determinants of Health   Financial Resource Strain: Low Risk  (02/15/2023)   Overall Financial Resource Strain (CARDIA)    Difficulty of Paying Living Expenses: Not hard at all  Food Insecurity: No Food Insecurity (02/15/2023)   Hunger Vital Sign    Worried About Running Out of Food in the Last Year: Never true    Ran Out of Food in the Last Year: Never true  Transportation Needs: No Transportation Needs (02/15/2023)   PRAPARE - Administrator, Civil Service (Medical): No    Lack of Transportation (Non-Medical): No  Physical Activity: Sufficiently Active (02/15/2023)   Exercise Vital Sign    Days of Exercise per Week: 5 days    Minutes of Exercise per Session: 40 min  Stress: Stress Concern Present (02/15/2023)   [employer] of Occupational Health - Occupational Stress Questionnaire    Feeling of Stress : To some extent  Social Connections: Moderately Integrated (02/15/2023)   Social Connection and Isolation Panel [NHANES]    Frequency of Communication with Friends and Family: More than three times a week    Frequency of Social Gatherings with Friends and Family: More than three times a week    Attends Religious Services: More than 4 times per year    Active Member of [employer] or Organizations: Yes    Attends Banker Meetings: 1 to 4 times per year    Marital Status: Widowed  Catering manager Violence: Not on file   Social History   Tobacco Use  Smoking Status Never  Smokeless Tobacco Never   Social History   Substance and Sexual Activity  Alcohol Use Yes   Alcohol/week: 3.0 standard drinks of alcohol   Types: 3 Glasses of wine per week   Comment: occ    Family History:  Family History  Problem Relation Age of Onset   Cancer Mother        breast   Cancer Father         lung   Hearing loss Father  Cancer Brother        gall bladder   Arthritis Maternal Grandmother    Stroke Maternal Grandmother    Breast cancer Paternal Grandmother    Anxiety disorder Paternal Grandmother     Past medical history, surgical history, medications, allergies, family history and social history reviewed with patient today and changes made to appropriate areas of the chart.   Review of Systems  Constitutional: Negative.   HENT:  Positive for ear pain (left ear pain/fullness) and hearing loss (slight in left ear, deaf in right ear). Negative for sore throat.   Eyes: Negative.   Respiratory: Negative.    Cardiovascular: Negative.   Gastrointestinal: Negative.   Genitourinary: Negative.   Musculoskeletal: Negative.   Skin: Negative.   Neurological: Negative.   Psychiatric/Behavioral: Negative.     All other ROS negative except what is listed above and in the HPI.      Objective:    BP 102/70 (BP Location: Left Arm)   Pulse 79   Temp (!) 97 F (36.1 C)   Ht 5' (1.524 m)   Wt 109 lb 3.2 oz (49.5 kg)   SpO2 100%   BMI 21.33 kg/m   Wt Readings from Last 3 Encounters:  09/29/23 109 lb 3.2 oz (49.5 kg)  02/15/23 114 lb 12.8 oz (52.1 kg)  12/31/22 118 lb (53.5 kg)    Physical Exam Vitals and nursing note reviewed.  Constitutional:      General: She is not in acute distress.    Appearance: Normal appearance.  HENT:     Head: Normocephalic and atraumatic.     Right Ear: Tympanic membrane, ear canal and external ear normal.     Left Ear: Tympanic membrane, ear canal and external ear normal.  Eyes:     Conjunctiva/sclera: Conjunctivae normal.  Cardiovascular:     Rate and Rhythm: Normal rate and regular rhythm.     Pulses: Normal pulses.     Heart sounds: Normal heart sounds.  Pulmonary:     Effort: Pulmonary effort is normal.     Breath sounds: Normal breath sounds.  Abdominal:     Palpations: Abdomen is soft.     Tenderness: There is no abdominal  tenderness.  Musculoskeletal:        General: Normal range of motion.     Cervical back: Normal range of motion and neck supple. No tenderness.     Right lower leg: No edema.     Left lower leg: No edema.  Lymphadenopathy:     Cervical: No cervical adenopathy.  Skin:    General: Skin is warm and dry.  Neurological:     General: No focal deficit present.     Mental Status: She is alert and oriented to person, place, and time.     Cranial Nerves: No cranial nerve deficit.     Coordination: Coordination normal.     Gait: Gait normal.  Psychiatric:        Mood and Affect: Mood normal.        Behavior: Behavior normal.        Thought Content: Thought content normal.        Judgment: Judgment normal.     Results for orders placed or performed in visit on 02/15/23  CBC with Differential/Platelet  Result Value Ref Range   WBC 6.7 4.0 - 10.5 K/uL   RBC 3.96 3.87 - 5.11 Mil/uL   Hemoglobin 12.4 12.0 - 15.0 g/dL   HCT 47.4 25.9 -  46.0 %   MCV 91.0 78.0 - 100.0 fl   MCHC 34.3 30.0 - 36.0 g/dL   RDW 16.1 09.6 - 04.5 %   Platelets 425.0 (H) 150.0 - 400.0 K/uL   Neutrophils Relative % 52.4 43.0 - 77.0 %   Lymphocytes Relative 38.2 12.0 - 46.0 %   Monocytes Relative 7.4 3.0 - 12.0 %   Eosinophils Relative 1.0 0.0 - 5.0 %   Basophils Relative 1.0 0.0 - 3.0 %   Neutro Abs 3.5 1.4 - 7.7 K/uL   Lymphs Abs 2.6 0.7 - 4.0 K/uL   Monocytes Absolute 0.5 0.1 - 1.0 K/uL   Eosinophils Absolute 0.1 0.0 - 0.7 K/uL   Basophils Absolute 0.1 0.0 - 0.1 K/uL  Comprehensive metabolic panel  Result Value Ref Range   Sodium 138 135 - 145 mEq/L   Potassium 4.3 3.5 - 5.1 mEq/L   Chloride 103 96 - 112 mEq/L   CO2 28 19 - 32 mEq/L   Glucose, Bld 103 (H) 70 - 99 mg/dL   BUN 19 6 - 23 mg/dL   Creatinine, Ser 4.09 0.40 - 1.20 mg/dL   Total Bilirubin 0.4 0.2 - 1.2 mg/dL   Alkaline Phosphatase 59 39 - 117 U/L   AST 13 0 - 37 U/L   ALT 16 0 - 35 U/L   Total Protein 6.9 6.0 - 8.3 g/dL   Albumin 4.7 3.5 -  5.2 g/dL   GFR 811.91 >47.82 mL/min   Calcium 9.6 8.4 - 10.5 mg/dL  Iron, TIBC and Ferritin Panel  Result Value Ref Range   Iron 65 45 - 160 mcg/dL   TIBC 956 213 - 086 mcg/dL (calc)   %SAT 21 16 - 45 % (calc)   Ferritin 33 16 - 232 ng/mL  TSH  Result Value Ref Range   TSH 0.96 0.35 - 5.50 uIU/mL  Hemoglobin A1c  Result Value Ref Range   Hgb A1c MFr Bld 5.6 4.6 - 6.5 %  Vitamin B12  Result Value Ref Range   Vitamin B-12 759 211 - 911 pg/mL      Assessment & Plan:   Problem List Items Addressed This Visit       Other   Hypercholesterolemia    Controlled with diet. Check CMP, CBC, lipid panel today.       Relevant Orders   CBC with Differential/Platelet   Comprehensive metabolic panel   Lipid panel   Vitamin D deficiency    History of vitamin D deficiency, will check levels and treat based on results.       Relevant Orders   VITAMIN D 25 Hydroxy (Vit-D Deficiency, Fractures)   Prediabetes    Check A1c today and treat based on results.       Relevant Orders   Hemoglobin A1c   Routine general medical examination at a health care facility - Primary    Health maintenance reviewed and updated. Discussed nutrition, exercise. Check CMP, CBC today. Follow-up 1 year.        Other Visit Diagnoses     Screen for colon cancer       Colguard ordered today   Relevant Orders   Cologuard   Immunization due       Flu vaccine given today   Relevant Orders   Flu vaccine trivalent PF, 6mos and older(Flulaval,Afluria,Fluarix,Fluzone)        Follow up plan: Return in about 1 year (around 09/28/2024) for CPE.   LABORATORY TESTING:  - Pap smear: up  to date  IMMUNIZATIONS:   - Tdap: Tetanus vaccination status reviewed: last tetanus booster within 10 years. - Influenza: Administered today - Pneumovax: Not applicable - Prevnar: Not applicable - HPV: Not applicable - Shingrix vaccine:  declined today  she can schedule a nurse visit if interested before next  visit  SCREENING: -Mammogram: Not applicable  - Colonoscopy: Ordered today  - Bone Density: Not applicable   PATIENT COUNSELING:   Advised to take 1 mg of folate supplement per day if capable of pregnancy.   Sexuality: Discussed sexually transmitted diseases, partner selection, use of condoms, avoidance of unintended pregnancy  and contraceptive alternatives.   Advised to avoid cigarette smoking.  I discussed with the patient that most people either abstain from alcohol or drink within safe limits (<=14/week and <=4 drinks/occasion for males, <=7/weeks and <= 3 drinks/occasion for females) and that the risk for alcohol disorders and other health effects rises proportionally with the number of drinks per week and how often a drinker exceeds daily limits.  Discussed cessation/primary prevention of drug use and availability of treatment for abuse.   Diet: Encouraged to adjust caloric intake to maintain  or achieve ideal body weight, to reduce intake of dietary saturated fat and total fat, to limit sodium intake by avoiding high sodium foods and not adding table salt, and to maintain adequate dietary potassium and calcium preferably from fresh fruits, vegetables, and low-fat dairy products.    stressed the importance of regular exercise  Injury prevention: Discussed safety belts, safety helmets, smoke detector, smoking near bedding or upholstery.   Dental health: Discussed importance of regular tooth brushing, flossing, and dental visits.    NEXT PREVENTATIVE PHYSICAL DUE IN 1 YEAR. Return in about 1 year (around 09/28/2024) for CPE.  Kriya Westra A Charlye Spare

## 2023-09-29 NOTE — Patient Instructions (Signed)
It was great to see you!  We are checking your labs today and will let you know the results via mychart/phone.   Start allegra and flonase nasal spray daily  If your symptoms are still there, schedule an appointment with audiology. I am happy to put in a referral if you would like.    Let's follow-up in 1 year, sooner if you have concerns.  If a referral was placed today, you will be contacted for an appointment. Please note that routine referrals can sometimes take up to 3-4 weeks to process. Please call our office if you haven't heard anything after this time frame.  Take care,  Rodman Pickle, NP

## 2023-09-29 NOTE — Assessment & Plan Note (Signed)
History of vitamin D deficiency, will check levels and treat based on results.  

## 2023-09-29 NOTE — Assessment & Plan Note (Signed)
Controlled with diet. Check CMP, CBC, lipid panel today.

## 2023-09-29 NOTE — Assessment & Plan Note (Signed)
Health maintenance reviewed and updated. Discussed nutrition, exercise. Check CMP, CBC today. Follow-up 1 year.   

## 2023-09-29 NOTE — Assessment & Plan Note (Signed)
Check A1c today and treat based on results.  

## 2023-09-30 ENCOUNTER — Encounter: Payer: Self-pay | Admitting: Nurse Practitioner

## 2023-10-01 MED ORDER — PREDNISONE 20 MG PO TABS: 40.0000 mg | ORAL_TABLET | Freq: Every day | ORAL | 0 refills | Status: DC

## 2023-10-19 LAB — COLOGUARD: COLOGUARD: NEGATIVE

## 2023-11-25 ENCOUNTER — Other Ambulatory Visit (HOSPITAL_COMMUNITY)
Admission: RE | Admit: 2023-11-25 | Discharge: 2023-11-25 | Disposition: A | Discharge status: Home / Self Care | Payer: No Typology Code available for payment source | Source: Ambulatory Visit | Attending: Nurse Practitioner | Admitting: Nurse Practitioner

## 2023-11-25 ENCOUNTER — Ambulatory Visit: Payer: No Typology Code available for payment source | Admitting: Nurse Practitioner

## 2023-11-25 VITALS — BP 131/77 | HR 99 | Temp 97.0°F | Resp 18 | Wt 108.4 lb

## 2023-11-25 DIAGNOSIS — Z113 Encounter for screening for infections with a predominantly sexual mode of transmission: Secondary | ICD-10-CM

## 2023-11-25 LAB — URINE CYTOLOGY ANCILLARY ONLY
Chlamydia: NEGATIVE
Comment: NEGATIVE
Comment: NEGATIVE
Comment: NORMAL
Neisseria Gonorrhea: NEGATIVE
Trichomonas: NEGATIVE

## 2023-11-25 LAB — CERVICOVAGINAL ANCILLARY ONLY
Chlamydia: NEGATIVE
Comment: NEGATIVE
Comment: NEGATIVE
Comment: NORMAL
Neisseria Gonorrhea: NEGATIVE
Trichomonas: NEGATIVE

## 2023-11-25 NOTE — Patient Instructions (Signed)
 Go to lab

## 2023-11-25 NOTE — Progress Notes (Signed)
   Acute Office Visit  Subjective:    Patient ID: Tammy Gilmore, female    DOB: 17-Oct-1971, 53 y.o.   MRN: [id number]  Chief Complaint  Patient presents with   OFFICE VISIT     PT is requesting STD testing due to recent new partner     HPI Patient is in today for STD screen. Last sexual encounter was 58month ago, unprotected with new partner. She denies any symptoms.  Outpatient Medications Prior to Visit  Medication Sig   Calcium Citrate-Vitamin D  315-5 MG-MCG TABS Take by mouth.   cyanocobalamin  1000 MCG tablet Take 1,000 mcg by mouth daily.   cyclobenzaprine (FLEXERIL) 10 MG tablet Take 10 mg by mouth as needed.   estradiol (ESTRACE) 0.1 MG/GM vaginal cream Insert 1/4 applicatorful in the vagina 3 times a week for one month then twice weekly at HS.   MAGNESIUM PO Take 200 mg by mouth daily.   Multiple Vitamin (MULTI-VITAMIN) tablet Take by mouth.   Omega-3 Fatty Acids (FISH OIL) 1000 MG CAPS Take 1,000 mg by mouth daily.   phenazopyridine (PYRIDIUM) 200 MG tablet Take 200 mg by mouth 3 (three) times daily.   predniSONE  (DELTASONE ) 20 MG tablet Take 2 tablets (40 mg total) by mouth daily with breakfast.   No facility-administered medications prior to visit.    Reviewed past medical and social history.  Review of Systems Per HPI     Objective:    Physical Exam Vitals and nursing note reviewed. Exam conducted with a chaperone present.  Genitourinary:    General: Normal vulva.     Exam position: Lithotomy position.     Labia:        Right: No rash or tenderness.        Left: No rash or tenderness.      Vagina: Normal.     Cervix: Normal.     Adnexa: Right adnexa normal and left adnexa normal.    BP 131/77 (BP Location: Left Arm, Patient Position: Sitting, Cuff Size: Normal)   Pulse 99   Temp (!) 97 F (36.1 C) (Temporal)   Resp 18   Wt 108 lb 6.4 oz (49.2 kg)   SpO2 98%   BMI 21.17 kg/m    No results found for any visits on 11/25/23.     Assessment &  Plan:   Problem List Items Addressed This Visit   None Visit Diagnoses       Screen for STD (sexually transmitted disease)    -  Primary   Relevant Orders   HIV Antibody (routine testing w rflx)   RPR   Urine cytology ancillary only   Hepatitis C antibody   Hep B Core Ab W/Reflex   Cervicovaginal ancillary only      No orders of the defined types were placed in this encounter.  Return if symptoms worsen or fail to improve.    Roselie Mood, NP

## 2023-11-26 ENCOUNTER — Encounter: Payer: Self-pay | Admitting: Nurse Practitioner

## 2023-11-26 LAB — HEPATITIS C ANTIBODY: Hepatitis C Ab: NONREACTIVE

## 2023-11-26 LAB — RPR: RPR Ser Ql: NONREACTIVE

## 2023-11-26 LAB — HEPATITIS B CORE AB W/REFLEX: Hep B Core Total Ab: NEGATIVE

## 2023-11-26 LAB — HIV ANTIBODY (ROUTINE TESTING W REFLEX): HIV 1&2 Ab, 4th Generation: NONREACTIVE

## 2024-08-01 LAB — HEPATIC FUNCTION PANEL
ALT: 20 U/L (ref 7–35)
AST: 11 — AB (ref 13–35)
Alkaline Phosphatase: 74 (ref 25–125)

## 2024-08-01 LAB — LIPID PANEL
Cholesterol: 243 — AB (ref 0–200)
HDL: 83 — AB (ref 35–70)
LDL Cholesterol: 144
Triglycerides: 91 (ref 40–160)

## 2024-08-01 LAB — CBC AND DIFFERENTIAL
HCT: 39 (ref 36–46)
Hemoglobin: 12.3 (ref 12.0–16.0)
Platelets: 432 K/uL — AB (ref 150–400)
WBC: 5

## 2024-08-01 LAB — COMPREHENSIVE METABOLIC PANEL WITH GFR
Albumin: 4.7 (ref 3.5–5.0)
eGFR: 104

## 2024-08-01 LAB — BASIC METABOLIC PANEL WITH GFR
BUN: 15 (ref 4–21)
CO2: 24 — AB (ref 13–22)
Chloride: 101 (ref 99–108)
Creatinine: 0.7 (ref 0.5–1.1)
Glucose: 92
Potassium: 4.4 meq/L (ref 3.5–5.1)
Sodium: 139 (ref 137–147)

## 2024-08-01 LAB — VITAMIN D 25 HYDROXY (VIT D DEFICIENCY, FRACTURES): Vit D, 25-Hydroxy: 71

## 2024-08-01 LAB — HEMOGLOBIN A1C: Hemoglobin A1C: 5.1

## 2024-08-13 ENCOUNTER — Encounter: Payer: Self-pay | Admitting: Nurse Practitioner

## 2024-08-16 ENCOUNTER — Ambulatory Visit: Admitting: Nurse Practitioner

## 2024-08-16 ENCOUNTER — Encounter: Payer: Self-pay | Admitting: Nurse Practitioner

## 2024-08-16 VITALS — BP 110/72 | HR 75 | Temp 97.1°F | Ht 60.0 in | Wt 103.4 lb

## 2024-08-16 DIAGNOSIS — F5101 Primary insomnia: Secondary | ICD-10-CM | POA: Insufficient documentation

## 2024-08-16 DIAGNOSIS — E7800 Pure hypercholesterolemia, unspecified: Secondary | ICD-10-CM | POA: Diagnosis not present

## 2024-08-16 MED ORDER — ZOLPIDEM TARTRATE 5 MG PO TABS: 5.0000 mg | ORAL_TABLET | Freq: Every evening | ORAL | 1 refills | Status: AC | PRN

## 2024-08-16 NOTE — Assessment & Plan Note (Signed)
 Chronic insomnia with difficulty maintaining sleep. Ambien  was effective previously without side effects. Prescribe Ambien  5mg , one tablet daily as needed at bedtime. Continue sleep hygiene practices. Follow-up in 2 months.

## 2024-08-16 NOTE — Assessment & Plan Note (Addendum)
 Chronic, stable. LDL is 144 mg/dL, likely genetic, with low cardiovascular risk. No statin is needed as the 10-year risk of heart attack or stroke is 1%. Consider a CT heart scan for coronary artery calcification if desired. Reassess cholesterol levels and cardiovascular risk at the next follow-up.

## 2024-08-16 NOTE — Patient Instructions (Signed)
 It was great to see you!  Start ambien  1 tablet daily as needed at bedtime  Keep doing your normal sleep routine   Let's follow-up in 2 months, sooner if you have concerns.  If a referral was placed today, you will be contacted for an appointment. Please note that routine referrals can sometimes take up to 3-4 weeks to process. Please call our office if you haven't heard anything after this time frame.  Take care,  Tinnie Harada, NP

## 2024-08-16 NOTE — Progress Notes (Signed)
 Established Patient Office Visit  Subjective   Patient ID: SOLARIS KRAM, female    DOB: 1971/02/09  Age: 53 y.o. MRN: [id number]  Chief Complaint  Patient presents with   Insomnia    Unable to sleep   HPI  Discussed the use of AI scribe software for clinical note transcription with the patient, who gave verbal consent to proceed.  History of Present Illness   Tammy Gilmore is a 53 year old female who presents with chronic insomnia.  She experiences difficulty staying asleep, waking around 1 or 2 AM, and is unable to return to sleep. Over-the-counter remedies such as Benadryl, Advil, melatonin, CBD, and magnesium have been ineffective. Lifestyle modifications, including a regular sleep routine, avoiding screens before bed, and using relaxation techniques, have not improved her sleep. She underwent surgical menopause at age 68 and occasionally has night sweats. Ambien  was previously effective, and recently allowed her to sleep for eight hours. She is not on any prescription medications and prefers to avoid regular medication use.  Her social history includes regular physical activity, a healthy diet, and active involvement in her church and social circles. She denies changes in stress levels, depression, or anxiety. She does not consume caffeine or sugar.       ROS See pertinent positives and negatives per HPI.    Objective:     BP 110/72 (BP Location: Left Arm, Patient Position: Sitting)   Pulse 75   Temp (!) 97.1 F (36.2 C)   Ht 5' (1.524 m)   Wt 103 lb 6.4 oz (46.9 kg)   SpO2 100%   BMI 20.19 kg/m    Physical Exam Vitals and nursing note reviewed.  Constitutional:      General: She is not in acute distress.    Appearance: Normal appearance.  HENT:     Head: Normocephalic.  Eyes:     Conjunctiva/sclera: Conjunctivae normal.  Cardiovascular:     Rate and Rhythm: Normal rate and regular rhythm.     Pulses: Normal pulses.     Heart sounds: Normal heart  sounds.  Pulmonary:     Effort: Pulmonary effort is normal.     Breath sounds: Normal breath sounds.  Musculoskeletal:     Cervical back: Normal range of motion.  Skin:    General: Skin is warm.  Neurological:     General: No focal deficit present.     Mental Status: She is alert and oriented to person, place, and time.  Psychiatric:        Mood and Affect: Mood normal.        Behavior: Behavior normal.        Thought Content: Thought content normal.        Judgment: Judgment normal.    The 10-year ASCVD risk score (Arnett DK, et al., 2019) is: 1.2%* (Cholesterol units were assumed)    Assessment & Plan:   Problem List Items Addressed This Visit       Other   Hypercholesterolemia   Chronic, stable. LDL is 144 mg/dL, likely genetic, with low cardiovascular risk. No statin is needed as the 10-year risk of heart attack or stroke is 1%. Consider a CT heart scan for coronary artery calcification if desired. Reassess cholesterol levels and cardiovascular risk at the next follow-up.       Primary insomnia - Primary   Chronic insomnia with difficulty maintaining sleep. Ambien  was effective previously without side effects. Prescribe Ambien  5mg , one tablet daily as needed at  bedtime. Continue sleep hygiene practices. Follow-up in 2 months.        Return in about 2 months (around 10/16/2024) for CPE.    Tinnie DELENA Harada, NP

## 2024-08-17 ENCOUNTER — Telehealth: Payer: Self-pay

## 2024-08-17 NOTE — Telephone Encounter (Signed)
 I called patient in regarding to receiving a fax from [facility] that Zolpidem  was not covered by her insurance. Per Lauren patient can use a Good Rx card and pay $21 or change her prescription to Lunesta. Patient said that she used a discount card and got the Rx.

## 2024-08-17 NOTE — Telephone Encounter (Signed)
 Copied from CRM #[id number]. Topic: General - Other >> Aug 17, 2024  9:24 AM Turkey A wrote: Reason for CRM: Patient was returning call from [facility] call

## 2024-08-17 NOTE — Telephone Encounter (Signed)
 Left message for patient to return call.

## 2024-10-16 ENCOUNTER — Encounter: Admitting: Nurse Practitioner

## 2024-10-24 DIAGNOSIS — N950 Postmenopausal bleeding: Secondary | ICD-10-CM | POA: Diagnosis not present

## 2024-10-24 DIAGNOSIS — N952 Postmenopausal atrophic vaginitis: Secondary | ICD-10-CM | POA: Diagnosis not present

## 2024-10-24 DIAGNOSIS — N941 Unspecified dyspareunia: Secondary | ICD-10-CM | POA: Diagnosis not present

## 2024-10-26 ENCOUNTER — Encounter: Payer: Self-pay | Admitting: Nurse Practitioner

## 2024-10-26 ENCOUNTER — Ambulatory Visit (INDEPENDENT_AMBULATORY_CARE_PROVIDER_SITE_OTHER): Admitting: Nurse Practitioner

## 2024-10-26 VITALS — BP 120/72 | HR 83 | Temp 97.5°F | Ht 60.0 in | Wt 107.4 lb

## 2024-10-26 DIAGNOSIS — F5101 Primary insomnia: Secondary | ICD-10-CM | POA: Diagnosis not present

## 2024-10-26 DIAGNOSIS — R7303 Prediabetes: Secondary | ICD-10-CM

## 2024-10-26 DIAGNOSIS — E559 Vitamin D deficiency, unspecified: Secondary | ICD-10-CM

## 2024-10-26 DIAGNOSIS — E7800 Pure hypercholesterolemia, unspecified: Secondary | ICD-10-CM

## 2024-10-26 DIAGNOSIS — Z0000 Encounter for general adult medical examination without abnormal findings: Secondary | ICD-10-CM

## 2024-10-26 NOTE — Assessment & Plan Note (Signed)
 Health maintenance reviewed and updated. Discussed nutrition, exercise. Follow-up 1 year.

## 2024-10-26 NOTE — Assessment & Plan Note (Signed)
 Chronic, stable. Most recent A1c 5.1%. Continue regular exercise and healthy eating.

## 2024-10-26 NOTE — Patient Instructions (Signed)
 It was great to see you!  Keep up the great work!   Let's follow-up in 1 year, sooner if you have concerns.  If a referral was placed today, you will be contacted for an appointment. Please note that routine referrals can sometimes take up to 3-4 weeks to process. Please call our office if you haven't heard anything after this time frame.  Take care,  Tinnie Harada, NP

## 2024-10-26 NOTE — Assessment & Plan Note (Signed)
 Chronic, stable. Continue Ambien  5mg , one tablet daily as needed at bedtime. Continue sleep hygiene practices. Follow-up in 1 year

## 2024-10-26 NOTE — Progress Notes (Signed)
 BP 120/72 (BP Location: Left Arm, Patient Position: Sitting, Cuff Size: Small)   Pulse 83   Temp (!) 97.5 F (36.4 C)   Ht 5' (1.524 m)   Wt 107 lb 6.4 oz (48.7 kg)   SpO2 99%   BMI 20.98 kg/m    Subjective:    Patient ID: Tammy Gilmore, female    DOB: November 14, 1971, 53 y.o.   MRN: [id number]  CC: Chief Complaint  Patient presents with   Annual Exam    No concerns    HPI: Tammy Gilmore is a 53 y.o. female presenting on 10/26/2024 for comprehensive medical examination. Current medical complaints include:none  She currently lives with: alone Menopausal Symptoms: no  Depression and Anxiety Screen done today and results listed below:     10/26/2024    1:09 PM 11/25/2023   11:24 AM 09/29/2023    9:23 AM 12/31/2022    1:28 PM 12/18/2022    4:07 PM  Depression screen PHQ 2/9  Decreased Interest 0 0 0 0 0  Down, Depressed, Hopeless 0 0 0 0 0  PHQ - 2 Score 0 0 0 0 0  Altered sleeping 0  0    Tired, decreased energy 0  0    Change in appetite 0  0    Feeling bad or failure about yourself  0  0    Trouble concentrating 0  0    Moving slowly or fidgety/restless 0  0    Suicidal thoughts 0  0    PHQ-9 Score 0  0     Difficult doing work/chores Not difficult at all  Not difficult at all       Data saved with a previous flowsheet row definition      10/26/2024    1:09 PM 09/29/2023    9:23 AM 09/17/2022    3:28 PM  GAD 7 : Generalized Anxiety Score  Nervous, Anxious, on Edge 0 0 0  Control/stop worrying 0 0 0  Worry too much - different things 0 0 0  Trouble relaxing 0 0 0  Restless 0 0 0  Easily annoyed or irritable 0 0 0  Afraid - awful might happen 0 0 0  Total GAD 7 Score 0 0 0  Anxiety Difficulty Not difficult at all Not difficult at all     The patient does not have a history of falls. I did not complete a risk assessment for falls. A plan of care for falls was not documented.   Past Medical History:  Past Medical History:  Diagnosis Date   Allergy  Seasonal   Anxiety Comes And goes   BRCA gene positive    BRCA 2   Cancer (HCC) Brca 2 positive   Hyperlipidemia     Surgical History:  Past Surgical History:  Procedure Laterality Date   ABDOMINAL HYSTERECTOMY  Oophorectomy   ovaries and fallopian tubes   BREAST SURGERY  2013   CESAREAN SECTION  08/10/01   double masectomy  2011   MASTECTOMY      Medications:  Current Outpatient Medications on File Prior to Visit  Medication Sig   cyanocobalamin  1000 MCG tablet Take 1,000 mcg by mouth daily.   cyclobenzaprine (FLEXERIL) 10 MG tablet Take 10 mg by mouth as needed.   estradiol (ESTRACE) 0.1 MG/GM vaginal cream Insert 1/4 applicatorful in the vagina 3 times a week for one month then twice weekly at HS.   MAGNESIUM PO Take 200 mg by mouth  daily.   Multiple Vitamin (MULTI-VITAMIN) tablet Take by mouth.   Omega-3 Fatty Acids (FISH OIL) 1000 MG CAPS Take 1,000 mg by mouth daily.   zolpidem  (AMBIEN ) 5 MG tablet Take 1 tablet (5 mg total) by mouth at bedtime as needed for sleep.   Calcium Citrate-Vitamin D  315-5 MG-MCG TABS Take by mouth. (Patient not taking: Reported on 10/26/2024)   No current facility-administered medications on file prior to visit.    Allergies:  No Known Allergies  Social History:  Social History   Socioeconomic History   Marital status: Widowed    Spouse name: Not on file   Number of children: 2   Years of education: Not on file   Highest education level: Bachelor's degree (e.g., BA, AB, BS)  Occupational History   Not on file  Tobacco Use   Smoking status: Never   Smokeless tobacco: Never  Vaping Use   Vaping status: Never Used  Substance and Sexual Activity   Alcohol use: Yes    Alcohol/week: 3.0 standard drinks of alcohol    Types: 3 Glasses of wine per week    Comment: occ   Drug use: Never   Sexual activity: Not Currently    Birth control/protection: Surgical  Other Topics Concern   Not on file  Social History Narrative   Not on file    Social Drivers of Health   Financial Resource Strain: Low Risk  (08/14/2024)   Overall Financial Resource Strain (CARDIA)    Difficulty of Paying Living Expenses: Not hard at all  Food Insecurity: No Food Insecurity (08/14/2024)   Hunger Vital Sign    Worried About Running Out of Food in the Last Year: Never true    Ran Out of Food in the Last Year: Never true  Transportation Needs: No Transportation Needs (08/14/2024)   PRAPARE - Administrator, Civil Service (Medical): No    Lack of Transportation (Non-Medical): No  Physical Activity: Sufficiently Active (08/14/2024)   Exercise Vital Sign    Days of Exercise per Week: 5 days    Minutes of Exercise per Session: 50 min  Stress: Stress Concern Present (08/14/2024)   [employer] of Occupational Health - Occupational Stress Questionnaire    Feeling of Stress: To some extent  Social Connections: Moderately Integrated (08/14/2024)   Social Connection and Isolation Panel    Frequency of Communication with Friends and Family: More than three times a week    Frequency of Social Gatherings with Friends and Family: More than three times a week    Attends Religious Services: More than 4 times per year    Active Member of [employer] or Organizations: Yes    Attends Banker Meetings: More than 4 times per year    Marital Status: Widowed  Catering Manager Violence: Not on file   Social History   Tobacco Use  Smoking Status Never  Smokeless Tobacco Never   Social History   Substance and Sexual Activity  Alcohol Use Yes   Alcohol/week: 3.0 standard drinks of alcohol   Types: 3 Glasses of wine per week   Comment: occ    Family History:  Family History  Problem Relation Age of Onset   Cancer Mother        breast   Cancer Father        lung   Hearing loss Father    Cancer Brother        gall bladder   Arthritis Maternal Grandmother  Stroke Maternal Grandmother    Breast cancer Paternal Grandmother     Anxiety disorder Paternal Grandmother     Past medical history, surgical history, medications, allergies, family history and social history reviewed with patient today and changes made to appropriate areas of the chart.   Review of Systems  Constitutional: Negative.   HENT: Negative.    Eyes: Negative.   Respiratory: Negative.    Cardiovascular: Negative.   Gastrointestinal: Negative.   Genitourinary: Negative.   Musculoskeletal: Negative.   Skin: Negative.   Neurological: Negative.   Psychiatric/Behavioral: Negative.     All other ROS negative except what is listed above and in the HPI.      Objective:    BP 120/72 (BP Location: Left Arm, Patient Position: Sitting, Cuff Size: Small)   Pulse 83   Temp (!) 97.5 F (36.4 C)   Ht 5' (1.524 m)   Wt 107 lb 6.4 oz (48.7 kg)   SpO2 99%   BMI 20.98 kg/m   Wt Readings from Last 3 Encounters:  10/26/24 107 lb 6.4 oz (48.7 kg)  08/16/24 103 lb 6.4 oz (46.9 kg)  11/25/23 108 lb 6.4 oz (49.2 kg)    Physical Exam Vitals and nursing note reviewed.  Constitutional:      General: She is not in acute distress.    Appearance: Normal appearance.  HENT:     Head: Normocephalic and atraumatic.     Right Ear: Tympanic membrane, ear canal and external ear normal.     Left Ear: Tympanic membrane, ear canal and external ear normal.     Mouth/Throat:     Mouth: Mucous membranes are moist.     Pharynx: No posterior oropharyngeal erythema.  Eyes:     Conjunctiva/sclera: Conjunctivae normal.  Cardiovascular:     Rate and Rhythm: Normal rate and regular rhythm.     Pulses: Normal pulses.     Heart sounds: Normal heart sounds.  Pulmonary:     Effort: Pulmonary effort is normal.     Breath sounds: Normal breath sounds.  Abdominal:     Palpations: Abdomen is soft.     Tenderness: There is no abdominal tenderness.  Musculoskeletal:        General: Normal range of motion.     Cervical back: Normal range of motion and neck supple.      Right lower leg: No edema.     Left lower leg: No edema.  Lymphadenopathy:     Cervical: No cervical adenopathy.  Skin:    General: Skin is warm and dry.  Neurological:     General: No focal deficit present.     Mental Status: She is alert and oriented to person, place, and time.     Cranial Nerves: No cranial nerve deficit.     Coordination: Coordination normal.     Gait: Gait normal.  Psychiatric:        Mood and Affect: Mood normal.        Behavior: Behavior normal.        Thought Content: Thought content normal.        Judgment: Judgment normal.     Results for orders placed or performed in visit on 08/16/24  CBC and differential   Collection Time: 08/01/24 12:00 AM  Result Value Ref Range   Hemoglobin 12.3 12.0 - 16.0   HCT 39 36 - 46   Platelets 432 (A) 150 - 400 K/uL   WBC 5.0   VITAMIN D  25 Hydroxy (  Vit-D Deficiency, Fractures)   Collection Time: 08/01/24 12:00 AM  Result Value Ref Range   Vit D, 25-Hydroxy 71   Basic metabolic panel with GFR   Collection Time: 08/01/24 12:00 AM  Result Value Ref Range   Glucose 92    BUN 15 4 - 21   CO2 24 (A) 13 - 22   Creatinine 0.7 0.5 - 1.1   Potassium 4.4 3.5 - 5.1 mEq/L   Sodium 139 137 - 147   Chloride 101 99 - 108  Comprehensive metabolic panel with GFR   Collection Time: 08/01/24 12:00 AM  Result Value Ref Range   eGFR 104    Albumin 4.7 3.5 - 5.0  Lipid panel   Collection Time: 08/01/24 12:00 AM  Result Value Ref Range   Triglycerides 91 40 - 160   Cholesterol 243 (A) 0 - 200   HDL 83 (A) 35 - 70   LDL Cholesterol 144   Hepatic function panel   Collection Time: 08/01/24 12:00 AM  Result Value Ref Range   Alkaline Phosphatase 74 25 - 125   ALT 20 7 - 35 U/L   AST 11 (A) 13 - 35  Hemoglobin A1c   Collection Time: 08/01/24 12:00 AM  Result Value Ref Range   Hemoglobin A1C 5.1       Assessment & Plan:   Problem List Items Addressed This Visit       Other   Hypercholesterolemia   Chronic, stable.  Continue regular exercise and healthy eating.       Vitamin D  deficiency   Chronic, stable. Continue OTC supplement.       Prediabetes   Chronic, stable. Most recent A1c 5.1%. Continue regular exercise and healthy eating.       Routine general medical examination at a health care facility - Primary   Health maintenance reviewed and updated. Discussed nutrition, exercise. Follow-up 1 year.        Primary insomnia   Chronic, stable. Continue Ambien  5mg , one tablet daily as needed at bedtime. Continue sleep hygiene practices. Follow-up in 1 year        Follow up plan: Return in about 1 year (around 10/26/2025) for CPE.   LABORATORY TESTING:  - Pap smear: up to date  IMMUNIZATIONS:   - Tdap: Tetanus vaccination status reviewed: last tetanus booster within 10 years. - Influenza: Declined - Pneumovax: Not applicable - Prevnar: Declined - HPV: Not applicable - Shingrix vaccine: Declined  SCREENING: -Mammogram: Not applicable  - Colonoscopy: Up to date  - Bone Density: Not applicable   PATIENT COUNSELING:   Advised to take 1 mg of folate supplement per day if capable of pregnancy.   Sexuality: Discussed sexually transmitted diseases, partner selection, use of condoms, avoidance of unintended pregnancy  and contraceptive alternatives.   Advised to avoid cigarette smoking.  I discussed with the patient that most people either abstain from alcohol or drink within safe limits (<=14/week and <=4 drinks/occasion for males, <=7/weeks and <= 3 drinks/occasion for females) and that the risk for alcohol disorders and other health effects rises proportionally with the number of drinks per week and how often a drinker exceeds daily limits.  Discussed cessation/primary prevention of drug use and availability of treatment for abuse.   Diet: Encouraged to adjust caloric intake to maintain  or achieve ideal body weight, to reduce intake of dietary saturated fat and total fat, to limit  sodium intake by avoiding high sodium foods and not adding table salt,  and to maintain adequate dietary potassium and calcium preferably from fresh fruits, vegetables, and low-fat dairy products.    stressed the importance of regular exercise  Injury prevention: Discussed safety belts, safety helmets, smoke detector, smoking near bedding or upholstery.   Dental health: Discussed importance of regular tooth brushing, flossing, and dental visits.    NEXT PREVENTATIVE PHYSICAL DUE IN 1 YEAR. Return in about 1 year (around 10/26/2025) for CPE.  Sacred Roa A David Towson

## 2024-10-26 NOTE — Assessment & Plan Note (Signed)
 Chronic, stable. Continue OTC supplement.

## 2024-10-26 NOTE — Assessment & Plan Note (Signed)
 Chronic, stable. Continue regular exercise and healthy eating.

## 2024-10-30 DIAGNOSIS — R9389 Abnormal findings on diagnostic imaging of other specified body structures: Secondary | ICD-10-CM | POA: Diagnosis not present

## 2024-10-30 DIAGNOSIS — N950 Postmenopausal bleeding: Secondary | ICD-10-CM | POA: Diagnosis not present

## 2024-11-07 DIAGNOSIS — N950 Postmenopausal bleeding: Secondary | ICD-10-CM | POA: Diagnosis not present

## 2024-11-14 ENCOUNTER — Telehealth: Admitting: Physician Assistant

## 2024-11-14 DIAGNOSIS — H10021 Other mucopurulent conjunctivitis, right eye: Secondary | ICD-10-CM | POA: Diagnosis not present

## 2024-11-14 MED ORDER — POLYMYXIN B-TRIMETHOPRIM 10000-0.1 UNIT/ML-% OP SOLN: 1.0000 [drp] | OPHTHALMIC | 0 refills | Status: AC

## 2024-11-14 NOTE — Progress Notes (Signed)
 E-Visit for [employer]   We are sorry that you are not feeling well.  Here is how we plan to help!  Based on what you have shared with me it looks like you have conjunctivitis.  Conjunctivitis is a common inflammatory or infectious condition of the eye that is often referred to as pink eye.  In most cases it is contagious (viral or bacterial). However, not all conjunctivitis requires antibiotics (ex. Allergic).  We have made appropriate suggestions for you based upon your presentation.  I have prescribed Polytrim  Ophthalmic drops 1-2 drops 4 times a day times 7 days  Pink eye can be highly contagious.  It is typically spread through direct contact with secretions, or contaminated objects or surfaces that one may have touched.  Strict handwashing is suggested with soap and water is urged.  If not available, use alcohol based had sanitizer.  Avoid unnecessary touching of the eye.  If you wear contact lenses, you will need to refrain from wearing them until you see no white discharge from the eye for at least 24 hours after being on medication.  You should see symptom improvement in 1-2 days after starting the medication regimen.  Call us  if symptoms are not improved in 1-2 days.  Home Care: Wash your hands often! Do not wear your contacts until you complete your treatment plan. Avoid sharing towels, bed linen, personal items with a person who has pink eye. See attention for anyone in your home with similar symptoms.  Get Help Right Away If: Your symptoms do not improve. You develop blurred or loss of vision. Your symptoms worsen (increased discharge, pain or redness)   Thank you for choosing an e-visit.  Your e-visit answers were reviewed by a board certified advanced clinical practitioner to complete your personal care plan. Depending upon the condition, your plan could have included both over the counter or prescription medications.  Please review your pharmacy choice. Make sure the  pharmacy is open so you can pick up prescription now. If there is a problem, you may contact your provider through [employer] and have the prescription routed to another pharmacy.  Your safety is important to us . If you have drug allergies check your prescription carefully.   For the next 24 hours you can use MyChart to ask questions about today's visit, request a non-urgent call back, or ask for a work or school excuse. You will get an email in the next two days asking about your experience. I hope that your e-visit has been valuable and will speed your recovery.  I have spent 5 minutes in review of e-visit questionnaire, review and updating patient chart, medical decision making and response to patient.   Migdalia Olejniczak, PA-C

## 2024-12-16 ENCOUNTER — Encounter

## 2024-12-16 ENCOUNTER — Telehealth: Admitting: Family Medicine

## 2024-12-16 DIAGNOSIS — H6690 Otitis media, unspecified, unspecified ear: Secondary | ICD-10-CM

## 2024-12-16 MED ORDER — AMOXICILLIN-POT CLAVULANATE 875-125 MG PO TABS: 1.0000 | ORAL_TABLET | Freq: Two times a day (BID) | ORAL | 0 refills | Status: AC

## 2024-12-16 NOTE — Progress Notes (Signed)
